# Patient Record
Sex: Female | Born: 1990 | Hispanic: Yes | Marital: Married | Smoking: Never smoker
Health system: Southern US, Community
[De-identification: ages and names within clinical notes are randomized; demographics above are authoritative.]

## PROBLEM LIST (undated history)

## (undated) ENCOUNTER — Emergency Department (HOSPITAL_COMMUNITY): Admission: EM | Payer: Medicaid Other

## (undated) DIAGNOSIS — O24419 Gestational diabetes mellitus in pregnancy, unspecified control: Secondary | ICD-10-CM

## (undated) DIAGNOSIS — Z789 Other specified health status: Secondary | ICD-10-CM

## (undated) HISTORY — PX: NO PAST SURGERIES: SHX2092

## (undated) HISTORY — DX: Gestational diabetes mellitus in pregnancy, unspecified control: O24.419

---

## 2016-10-29 ENCOUNTER — Ambulatory Visit (INDEPENDENT_AMBULATORY_CARE_PROVIDER_SITE_OTHER): Payer: Self-pay | Admitting: *Deleted

## 2016-10-29 DIAGNOSIS — Z3200 Encounter for pregnancy test, result unknown: Secondary | ICD-10-CM

## 2016-10-29 DIAGNOSIS — Z3201 Encounter for pregnancy test, result positive: Secondary | ICD-10-CM

## 2016-10-29 DIAGNOSIS — Z3400 Encounter for supervision of normal first pregnancy, unspecified trimester: Secondary | ICD-10-CM

## 2016-10-29 LAB — POCT PREGNANCY, URINE: Preg Test, Ur: POSITIVE — AB

## 2016-10-29 NOTE — Progress Notes (Signed)
Here for pregnancy test which was positive. LMP 07/04/16 and states has regular periods and is sure of that date. Is applying for medicaid. Would like to get prenatal care her. Will get blood work today and schedule anatomy us.

## 2016-10-31 LAB — HEMOGLOBINOPATHY EVALUATION
Ferritin: 47 ng/mL (ref 15–150)
HGB A2 QUANT: 2.3 % (ref 1.8–3.2)
HGB VARIANT: 0 %
Hgb A: 97.7 % (ref 96.4–98.8)
Hgb C: 0 %
Hgb F Quant: 0 % (ref 0.0–2.0)
Hgb S: 0 %
Hgb Solubility: NEGATIVE

## 2016-10-31 LAB — OBSTETRIC PANEL, INCLUDING HIV
Antibody Screen: NEGATIVE
BASOS ABS: 0 10*3/uL (ref 0.0–0.2)
Basos: 0 %
EOS (ABSOLUTE): 0.1 10*3/uL (ref 0.0–0.4)
Eos: 1 %
HEP B S AG: NEGATIVE
HIV SCREEN 4TH GENERATION: NONREACTIVE
Hematocrit: 39.4 % (ref 34.0–46.6)
Hemoglobin: 12.8 g/dL (ref 11.1–15.9)
IMMATURE GRANULOCYTES: 0 %
Immature Grans (Abs): 0 10*3/uL (ref 0.0–0.1)
LYMPHS ABS: 1.1 10*3/uL (ref 0.7–3.1)
Lymphs: 12 %
MCH: 29.2 pg (ref 26.6–33.0)
MCHC: 32.5 g/dL (ref 31.5–35.7)
MCV: 90 fL (ref 79–97)
MONOS ABS: 0.4 10*3/uL (ref 0.1–0.9)
Monocytes: 4 %
NEUTROS ABS: 7.7 10*3/uL — AB (ref 1.4–7.0)
Neutrophils: 83 %
PLATELETS: 228 10*3/uL (ref 150–379)
RBC: 4.38 x10E6/uL (ref 3.77–5.28)
RDW: 15.2 % (ref 12.3–15.4)
RPR: NONREACTIVE
Rh Factor: NEGATIVE
Rubella Antibodies, IGG: <0.9 index — ABNORMAL LOW (ref >0.99)
WBC: 9.3 10*3/uL (ref 3.4–10.8)

## 2016-10-31 NOTE — Progress Notes (Signed)
Agree with nursing staff's documentation of this patient's clinic encounter.  Ugonna Anyanwu, MD    

## 2016-11-01 ENCOUNTER — Encounter: Payer: Self-pay | Admitting: Obstetrics & Gynecology

## 2016-11-01 DIAGNOSIS — O9989 Other specified diseases and conditions complicating pregnancy, childbirth and the puerperium: Secondary | ICD-10-CM

## 2016-11-01 DIAGNOSIS — Z283 Underimmunization status: Secondary | ICD-10-CM | POA: Insufficient documentation

## 2016-11-01 DIAGNOSIS — O09899 Supervision of other high risk pregnancies, unspecified trimester: Secondary | ICD-10-CM | POA: Insufficient documentation

## 2016-11-06 ENCOUNTER — Encounter (HOSPITAL_COMMUNITY): Payer: Self-pay | Admitting: Obstetrics & Gynecology

## 2016-11-14 ENCOUNTER — Other Ambulatory Visit: Payer: Self-pay | Admitting: Obstetrics & Gynecology

## 2016-11-14 ENCOUNTER — Encounter (HOSPITAL_COMMUNITY): Payer: Self-pay | Admitting: *Deleted

## 2016-11-14 ENCOUNTER — Ambulatory Visit (HOSPITAL_COMMUNITY)
Admission: RE | Admit: 2016-11-14 | Discharge: 2016-11-14 | Disposition: A | Discharge status: Home / Self Care | Payer: Self-pay | Source: Ambulatory Visit | Attending: Obstetrics & Gynecology | Admitting: Obstetrics & Gynecology

## 2016-11-14 DIAGNOSIS — O360190 Maternal care for anti-D [Rh] antibodies, unspecified trimester, not applicable or unspecified: Secondary | ICD-10-CM | POA: Insufficient documentation

## 2016-11-14 DIAGNOSIS — Z3A23 23 weeks gestation of pregnancy: Secondary | ICD-10-CM | POA: Insufficient documentation

## 2016-11-14 DIAGNOSIS — Z3689 Encounter for other specified antenatal screening: Secondary | ICD-10-CM | POA: Insufficient documentation

## 2016-11-14 DIAGNOSIS — Z3400 Encounter for supervision of normal first pregnancy, unspecified trimester: Secondary | ICD-10-CM

## 2016-11-14 DIAGNOSIS — Z3687 Encounter for antenatal screening for uncertain dates: Secondary | ICD-10-CM | POA: Insufficient documentation

## 2016-11-14 DIAGNOSIS — Z3402 Encounter for supervision of normal first pregnancy, second trimester: Secondary | ICD-10-CM | POA: Insufficient documentation

## 2016-11-14 HISTORY — DX: Other specified health status: Z78.9

## 2016-11-22 ENCOUNTER — Ambulatory Visit (INDEPENDENT_AMBULATORY_CARE_PROVIDER_SITE_OTHER): Payer: Self-pay | Admitting: Advanced Practice Midwife

## 2016-11-22 ENCOUNTER — Encounter: Payer: Self-pay | Admitting: Advanced Practice Midwife

## 2016-11-22 VITALS — BP 113/74 | HR 88 | Ht 63.0 in | Wt 199.1 lb

## 2016-11-22 DIAGNOSIS — Z3400 Encounter for supervision of normal first pregnancy, unspecified trimester: Secondary | ICD-10-CM

## 2016-11-22 DIAGNOSIS — O0990 Supervision of high risk pregnancy, unspecified, unspecified trimester: Secondary | ICD-10-CM | POA: Insufficient documentation

## 2016-11-22 DIAGNOSIS — O9989 Other specified diseases and conditions complicating pregnancy, childbirth and the puerperium: Secondary | ICD-10-CM

## 2016-11-22 DIAGNOSIS — O0932 Supervision of pregnancy with insufficient antenatal care, second trimester: Secondary | ICD-10-CM

## 2016-11-22 DIAGNOSIS — O26892 Other specified pregnancy related conditions, second trimester: Secondary | ICD-10-CM

## 2016-11-22 DIAGNOSIS — Z124 Encounter for screening for malignant neoplasm of cervix: Secondary | ICD-10-CM

## 2016-11-22 DIAGNOSIS — Z113 Encounter for screening for infections with a predominantly sexual mode of transmission: Secondary | ICD-10-CM

## 2016-11-22 DIAGNOSIS — B3731 Acute candidiasis of vulva and vagina: Secondary | ICD-10-CM

## 2016-11-22 DIAGNOSIS — B373 Candidiasis of vulva and vagina: Secondary | ICD-10-CM

## 2016-11-22 DIAGNOSIS — Z283 Underimmunization status: Secondary | ICD-10-CM

## 2016-11-22 DIAGNOSIS — Z6791 Unspecified blood type, Rh negative: Secondary | ICD-10-CM

## 2016-11-22 DIAGNOSIS — Z23 Encounter for immunization: Secondary | ICD-10-CM

## 2016-11-22 DIAGNOSIS — Z2839 Other underimmunization status: Secondary | ICD-10-CM

## 2016-11-22 DIAGNOSIS — O09892 Supervision of other high risk pregnancies, second trimester: Secondary | ICD-10-CM

## 2016-11-22 LAB — POCT URINALYSIS DIP (DEVICE)
BILIRUBIN URINE: NEGATIVE
Glucose, UA: 100 mg/dL — AB
HGB URINE DIPSTICK: NEGATIVE
Ketones, ur: NEGATIVE mg/dL
Nitrite: NEGATIVE
PH: 5.5 (ref 5.0–8.0)
Protein, ur: NEGATIVE mg/dL
SPECIFIC GRAVITY, URINE: 1.02 (ref 1.005–1.030)
UROBILINOGEN UA: 0.2 mg/dL (ref 0.0–1.0)

## 2016-11-22 MED ORDER — PRENATAL PLUS 27-1 MG PO TABS: 1.0000 | ORAL_TABLET | Freq: Every day | ORAL | 12 refills | Status: DC

## 2016-11-22 MED ORDER — PRENATAL PLUS 27-1 MG PO TABS: 1.0000 | ORAL_TABLET | Freq: Every day | ORAL | 0 refills | Status: DC

## 2016-11-22 NOTE — Progress Notes (Signed)
Here for first prenatal care. We discussed that on the date she had her pregnancy test she said sure of LMP 07/04/16, on date of US said may have been 06/03/16. Now states 06/03/16 is correct so EDD changes. Given new patient education material. Signed up for babyscripts app only.

## 2016-11-22 NOTE — Patient Instructions (Signed)
AREA PEDIATRIC/FAMILY PRACTICE PHYSICIANS  [facility] [address], [address] [place], [place]  [address] Phone - [phone]   Fax - [phone]  [facility] [address] [place], [place] [address] Phone - [phone]   Fax - [phone]  [facility] [address] [place], [place]  [address] Phone - [phone]   Fax - [phone]  [facility] [address] [place], [place]  [address] Phone - [phone]   Fax - [phone]  [facility] [address] [place], [place]  [address] Phone - [phone]   Fax - [phone]  [facility] [address] [place], [place]  [address] Phone - [phone]   Fax - [phone]  [facility] [address] [place], [place]  [address] Phone - [phone]   Fax - [phone]  [facility] [address], [address] [place], [place]  [address] Phone - [phone]   Fax - [phone]  [facility] [address] [place], [place]  [address] Phone - [phone]   Fax - [phone] [facility] [address] [place], [place]  [address] Phone - [phone]   Fax - [phone]  [facility] [address] [place], [place]  [address] Phone - [phone]   Fax - [phone]  [facility] [address] [place], [place]  [address] Phone - [phone]   Fax - [phone]  [facility] [address], [address] [place], [place]  [address] Phone - [phone]   Fax - [phone]  [facility] GOSR[facility] [address] [place], [place]  [address] Phone - [phone]  [facility] [address] [place], [place]  [address] Phone - [phone]   Fax - [phone]  [facility] [address] [place], [place]  [address] Phone - [phone]   Fax - [phone]  [facility] [address] [place], [place]  [address] Phone - [phone]   Fax - [phone]  [facility] [address] [place], [place]  [address] Phone - [phone]   Fax - [phone]   [facility] [address] [place], [place]  [address] Phone - [phone]   Fax - [phone]  [facility] [address] [place], [place]  [address] Phone - [phone]   Fax - [phone]  [facility] [address] [place], [place]  [address] Phone - [phone]   Fax - [phone]  [facility] [address] [place], [place]  [address] Phone - [phone]   Fax - [phone]  [facility] [address] , [place]  [address] Phone - [phone]   Fax - [phone] [facility] [address] [place], [place]  [address] Phone - [phone]   Fax - [phone]  [facility] [address] [place], [place]  [address] Phone - [phone]   Fax - [phone]  [facility] [address] [place], [place]  [address] Phone - [phone]   Fax - [phone]  [facility]  Charlene Flemming MD [address]   [address] Phone [phone]  Fax [phone]   

## 2016-11-24 LAB — URINE CULTURE, OB REFLEX

## 2016-11-24 LAB — CULTURE, OB URINE

## 2016-11-26 DIAGNOSIS — Z6791 Unspecified blood type, Rh negative: Secondary | ICD-10-CM | POA: Insufficient documentation

## 2016-11-26 DIAGNOSIS — O26892 Other specified pregnancy related conditions, second trimester: Secondary | ICD-10-CM

## 2016-11-26 DIAGNOSIS — O0932 Supervision of pregnancy with insufficient antenatal care, second trimester: Secondary | ICD-10-CM | POA: Insufficient documentation

## 2016-11-26 LAB — CYTOLOGY - PAP
Chlamydia: NEGATIVE
Diagnosis: NEGATIVE
NEISSERIA GONORRHEA: NEGATIVE

## 2016-11-26 NOTE — Progress Notes (Signed)
  Subjective:    Loretha BrasilKaren Bermudez Sanchez is being seen today for her first obstetrical visit. No other care this pregnancy. Was seen in clinic for pregnancy verification 10/29/16 and Prenatal Panel and Anatomy US were ordered. Applying for pregnancy Medicaid. She is at 5458w5d gestation by LMP and 23 weeks US. Her obstetrical history is significant for nulliparity and late to care.  Patient does intend to breast feed. Pregnancy history fully reviewed.  Patient reports no complaints.  Review of Systems:   Review of Systems  Constitutional: Negative for chills and fever.  Gastrointestinal: Negative for abdominal pain.  Genitourinary: Negative for dysuria, vaginal bleeding and vaginal discharge.    Objective:     BP 113/74   Pulse 88   Ht 5\' 3"  (1.6 m)   Wt 199 lb 1.6 oz (90.3 kg)   LMP 06/03/2016   BMI 35.27 kg/m  Physical Exam  Nursing note and vitals reviewed. Constitutional: She is oriented to person, place, and time. She appears well-developed and well-nourished. No distress.  Eyes: No scleral icterus.  Neck: No thyromegaly present.  Cardiovascular: Normal rate and regular rhythm.  Respiratory: Effort normal and breath sounds normal. No respiratory distress.  GI: Soft. She exhibits no distension. There is no tenderness.  Genitourinary: No breast swelling, tenderness, discharge or bleeding. There is no lesion on the right labia. There is no lesion on the left labia. Uterus is enlarged. Uterus is not tender. Cervix exhibits discharge (large amount of curdlike discharge.) and friability (very friable w/ Pap). Cervix exhibits no motion tenderness. Right adnexum displays no mass, no tenderness and no fullness. Left adnexum displays no mass, no tenderness and no fullness. No tenderness or bleeding in the vagina. Vaginal discharge found.  Musculoskeletal: Normal range of motion. She exhibits no edema or deformity.  Neurological: She is alert and oriented to person, place, and time. She has  normal reflexes.  Skin: Skin is warm and dry.  Psychiatric: She has a normal mood and affect.    Maternal Exam:  Introitus: Vagina is positive for vaginal discharge.       Assessment:    Pregnancy: G1P0 Patient Active Problem List   Diagnosis Date Noted  . Supervision of normal first pregnancy, antepartum 11/22/2016  . Rubella non-immune status, antepartum 11/01/2016       Plan:  1. Supervision of normal first pregnancy, antepartum  - Culture, OB Urine - Cytology - PAP - Enroll Patient in Babyscripts - Flu Vaccine QUAD 36+ mos IM - prenatal vitamin w/FE, FA (PRENATAL 1 + 1) 27-1 MG TABS tablet; Take 1 tablet daily at 12 noon by mouth.  Dispense: 30 each; Refill: 12  2. Late to care  3. Rh neg  - Rhophylac at NV    4. Rubella NI   - Offer vaccination PP  5. VVC  - OTC Monistat 7   Initial labs reviewed. Prenatal vitamins. Problem list reviewed and updated. AFP3 discussed: too late for Quad. Discussed Panorama. Pt is self pay. Declines today.. Role of ultrasound in pregnancy discussed; fetal survey: results reviewed. Amniocentesis discussed: not indicated. Follow up in 4 weeks.    Dorathy KinsmanVirginia Kaislyn Gulas 11/26/2016

## 2016-12-19 ENCOUNTER — Encounter: Payer: Self-pay | Admitting: Medical

## 2016-12-20 ENCOUNTER — Ambulatory Visit (INDEPENDENT_AMBULATORY_CARE_PROVIDER_SITE_OTHER): Payer: Self-pay | Admitting: Student

## 2016-12-20 VITALS — BP 117/65 | HR 92 | Wt 203.1 lb

## 2016-12-20 DIAGNOSIS — Z23 Encounter for immunization: Secondary | ICD-10-CM

## 2016-12-20 DIAGNOSIS — Z3400 Encounter for supervision of normal first pregnancy, unspecified trimester: Secondary | ICD-10-CM

## 2016-12-20 MED ORDER — RHO D IMMUNE GLOBULIN 1500 UNIT/2ML IJ SOSY
300.0000 ug | PREFILLED_SYRINGE | Freq: Once | INTRAMUSCULAR | Status: AC
  Administered 2016-12-20: 300 ug via INTRAMUSCULAR

## 2016-12-20 NOTE — Progress Notes (Signed)
   PRENATAL VISIT NOTE  Subjective:  Kristen Fowler is a 26 y.o. G1P0 at 4981w4d being seen today for ongoing prenatal care.  She is currently monitored for the following issues for this low-risk pregnancy and has Rubella non-immune status, antepartum; Supervision of normal first pregnancy, antepartum; Rh negative, antepartum, second trimester; and Late prenatal care affecting pregnancy in second trimester on their problem list.  Patient reports no complaints.  Contractions: Not present. Vag. Bleeding: None.  Movement: Present. Denies leaking of fluid.   The following portions of the patient's history were reviewed and updated as appropriate: allergies, current medications, past family history, past medical history, past social history, past surgical history and problem list. Problem list updated.  Objective:   Vitals:   12/20/16 0755  BP: 117/65  Pulse: 92  Weight: 203 lb 1.6 oz (92.1 kg)    Fetal Status: Fetal Heart Rate (bpm): 151 Fundal Height: 28 cm Movement: Present     General:  Alert, oriented and cooperative. Patient is in no acute distress.  Skin: Skin is warm and dry. No rash noted.   Cardiovascular: Normal heart rate noted  Respiratory: Normal respiratory effort, no problems with respiration noted  Abdomen: Soft, gravid, appropriate for gestational age.  Pain/Pressure: Absent     Pelvic: Cervical exam deferred        Extremities: Normal range of motion.  Edema: None  Mental Status:  Normal mood and affect. Normal behavior. Normal judgment and thought content.   Assessment and Plan:  Pregnancy: G1P0 at 2581w4d  1. Supervision of normal first pregnancy, antepartum Patient doing well; no complaints. Extensive discussion today about LARC methods and circumcision decision.  - Glucose Tolerance, 2 Hours w/1 Hour - CBC - RPR - HIV antibody - Tdap vaccine greater than or equal to 7yo IM  Preterm labor symptoms and general obstetric precautions including but not  limited to vaginal bleeding, contractions, leaking of fluid and fetal movement were reviewed in detail with the patient. Please refer to After Visit Summary for other counseling recommendations.  Return in about 2 weeks (around 01/03/2017), or ROB.   Kristen Fowler, CNM

## 2016-12-20 NOTE — Progress Notes (Signed)
[employer] interpreter Claria DiceSan Juanita [phone]

## 2016-12-20 NOTE — Patient Instructions (Signed)
Etonogestrel implant Qu es este medicamento? El ETONOGESTREL es un dispositivo anticonceptivo (control de la natalidad). Se usa para evitar los embarazos. Se puede usar hasta por 3 aos. Este medicamento puede ser utilizado para otros usos; si tiene alguna pregunta consulte con su proveedor de atencin mdica o con su farmacutico. MARCAS COMUNES: Implanon, Nexplanon Qu le debo informar a mi profesional de la salud antes de tomar este medicamento? Necesita saber si usted presenta alguno de los siguientes problemas o situaciones: sangrado vaginal anormal enfermedad vascular o cogulos sanguneos cncer de mama, cervical, heptico depresin diabetes enfermedad de la vescula biliar dolores de cabeza enfermedad cardiaca o ataque cardiaco reciente alta presin sangunea alto nivel de colesterol enfermedad renal enfermedad heptica convulsiones fuma tabaco una reaccin alrgica o inusual al etonogestrel, otras hormonas, anestsicos o antispticos, medicamentos, alimentos, colorantes o conservantes si est embarazada o buscando quedar embarazada si est amamantando a un beb Cmo debo utilizar este medicamento? Un profesional de la salud inserta este dispositivo justo debajo de la piel en la parte interior del brazo. Hable con su pediatra para informarse acerca del uso de este medicamento en nios. Puede requerir atencin especial. Sobredosis: Pngase en contacto inmediatamente con un centro toxicolgico o una sala de urgencia si usted cree que haya tomado demasiado medicamento. ATENCIN: Este medicamento es solo para usted. No comparta este medicamento con nadie. Qu sucede si me olvido de una dosis? No se aplica en este caso. Qu puede interactuar con este medicamento? No tome este medicamento con ninguno de los siguientes frmacos: amprenavir bosentano fosamprenavir Este medicamento tambin podra interactuar con los siguientes medicamentos: barbitricos para inducir el sueo o para el  tratamiento de convulsiones ciertos medicamentos para infecciones micticas, tales como itraconazol y ketoconazol jugo de toronja griseofulvina medicamentos para tratar convulsiones, tales como carbamazepina, felbamato, oxcarbazepina, fenitona, topiramato modafinilo fenilbutazona rifampicina rufinamida algunos medicamentos para tratar la infeccin por el VIH, tales como atazanavir, indinavir, lopinavir, nelfinavir, tipranavir, ritonavir hierba de [place] Puede ser que esta lista no menciona todas las posibles interacciones. Informe a su profesional de la salud de todos los productos a base de hierbas, medicamentos de venta libre o suplementos nutritivos que est tomando. Si usted fuma, consume bebidas alcohlicas o si utiliza drogas ilegales, indqueselo tambin a su profesional de la salud. Algunas sustancias pueden interactuar con su medicamento. A qu debo estar atento al usar este medicamento? Este producto no protege contra la infeccin por el VIH (SIDA) u otras enfermedades de transmisin sexual. Usted debe sentir el implante al presionar con las yemas de los dedos sobre la piel donde se insert. Contacte a su mdico si no se siente el implante y usa un mtodo anticonceptivo no hormonal (como el condn) hasta que el mdico confirma que el implante est en su lugar. Si siente que el implante puede haber roto o doblado en su brazo, pngase en contacto con su proveedor de atencin mdica. Qu efectos secundarios puedo tener al utilizar este medicamento? Efectos secundarios que debe informar a su mdico o a su profesional de la salud tan pronto como sea posible: reacciones alrgicas, como erupcin cutnea, picazn o urticarias, e hinchazn de la cara, los labios o la lengua bultos en las mamas cambios en las emociones o el estado de nimo estado de nimo deprimido sangrado menstrual intenso o prolongado dolor, irritacin, hinchazn o moretones en el lugar de la insercin cicatriz en el lugar de la  insercin signos de infeccin en el sitio de insercin tales como fiebre, y enrojecimiento de   la piel, dolor o secrecin signos de Psychiatristembarazo signos y sntomas de un cogulo sanguneo, tales como problemas respiratorios; cambios en la visin; dolor en el pecho; dolor de cabeza severo, repentino; dolor, hinchazn, calor en la pierna; dificultad para hablar; entumecimiento o debilidad repentina de la cara, el brazo o la pierna signos y sntomas de lesin al hgado, como orina amarilla oscura o Elroymarrn; sensacin general de estar enfermo o sntomas gripales; heces claras; prdida de apetito; nuseas; dolor en la regin abdominal superior derecha; cansancio o debilidad inusual; color amarillento de los ojos o la piel sangrado vaginal inusual, secrecin signos y sntomas de un derrame cerebral, tales como cambios en la visin; confusin; dificultad para hablar o entender; dolores de cabeza severos; entumecimiento o debilidad repentina de la cara, el brazo o la pierna; problemas al Advertising account plannercaminar; Psychiatristmareo; prdida del equilibrio o la coordinacin Efectos secundarios que generalmente no requieren Psychologist, prison and probation servicesatencin mdica (infrmelos a su mdico o a Producer, television/film/videosu profesional de la salud si persisten o si son molestos): acn dolor de Retail buyerespalda dolor en las mamas cambios de peso mareos sensacin general de estar enfermo o sntomas gripales dolor de cabeza sangrado menstrual irregular nuseas dolor de garganta irritacin o inflamacin vaginal Puede ser que esta lista no menciona todos los posibles efectos secundarios. Comunquese a su mdico por asesoramiento mdico Hewlett-Packardsobre los efectos secundarios. Usted puede informar los efectos secundarios a la FDA por telfono al 1-800-FDA-1088. Dnde debo guardar mi medicina? Este medicamento se administra en hospitales o clnicas y no necesitar guardarlo en su domicilio. ATENCIN: Este folleto es un resumen. Puede ser que no cubra toda la posible informacin. Si usted tiene preguntas acerca de esta medicina,  consulte con su mdico, su farmacutico o su profesional de Radiographer, therapeuticla salud.  2018 Elsevier/Gold Standard (2016-01-26 00:00:00) Eleccin del mtodo anticonceptivo (Contraception Choices) La anticoncepcin (control de la natalidad) es el uso de cualquier mtodo o dispositivo para Location managerevitar el embarazo. A continuacin se indican algunos de esos mtodos. ANTICONCEPTIVOS HORMONALES  Un pequeo tubo colocado bajo la piel de la parte superior del brazo (implante). El tubo puede Geneticist, molecularpermanecer en el lugar durante 3 aos. El implante debe quitarse despus de 3 aos.  Inyecciones que se aplican cada 3 meses.  Pldoras que deben MetLifetomarse todos los das.  Parches que se cambian una vez por semana.  Un anillo que se coloca en la vagina (anillo vaginal). El anillo se deja en su lugar durante 3 semanas y se retira durante 1 semana. Luego se coloca un nuevo anillo en la vagina.  Pldoras para el control de la natalidad despus de Warehouse managertener sexo (relaciones sexuales) sin proteccin.  ANTICONCEPTIVOS DE Emeline DarlingBARRERA  Una cubierta delgada que se Botswanausa sobe el pene (condn masculino) que se coloca durante las relaciones sexuales.  Una cubierta blanda y suelta que se coloca en la vagina (condn femenino) antes de las relaciones sexuales.  Un dispositivo de goma que se aplica sobre el cuello del tero (diafragma). Este dispositivo debe ser hecho para usted. Se coloca en la vagina antes de Management consultanttener relaciones sexuales. Debe dejarlo colocado en la vagina durante 6 a 8 horas despus de las The St. Paul Travelersrelaciones sexuales.  Un capuchn pequeo y Du Pontsuave que se fija sobre el cuello del tero (capuchn cervical). Este capuchn debe ser hecho para usted. Debe dejarlo colocado en la vagina durante 48 horas despus de las The St. Paul Travelersrelaciones sexuales.  Una esponja que se coloca en la vagina antes de Management consultanttener relaciones sexuales.  Una sustancia qumica que destruye o impide que los espermatozoides  ingresen al cuello y al tero (espermicida). La sustancia qumica puede  ser en crema, gel, espuma o pldoras.  DISPOSITIVO DE CONTROL INTRAUTERINO (DIU)  El DIU es un pequeo dispositivo plstico en forma de T. Se coloca dentro del tero. Hay dos tipos de DIU: ? DIU de cobre. El dispositivo est recubierto en alambre de cobre. El cobre produce un lquido que Federated Department Storesdestruye los espermatozoides. Puede permanecer colocado durante 10 aos. ? DIU hormonal. La hormona impide que ocurra el embarazo. Puede permanecer colocado durante 5 aos.  MTODOS PERMANENTES  La mujer puede hacerse sellar, ligar u obstruir las trompas de [place] durante [place] and Herzegovinauna ciruga. Esto impide que el vulo llegue hasta el tero.  El mdico coloca un alambre diminuto o lo inserta en cada una de las trompas de AveraFalopio. Esto produce un tejido cicatrizal que obstruye las trompas de MillbrookFalopio.  En el hombre pueden ligarse los conductos por los que pasan los espermatozoides (vasectoma).  CONTROL DE LA NATALIDAD POR PLANIFICACIN FAMILIAR NATURAL  La planificacin familiar natural significa no tener Clinical research associaterelaciones sexuales o usar un mtodo anticonceptivo de Warehouse managerbarrera en los perodos frtiles de la [place].  Use a un almanaque para llevar un registro de la extensin de cada perodo y para National Cityconocer los das en los que puede quedar Nachusaembarazada.  Evite tener relaciones sexuales durante la ovulacin.  Use un termmetro para medir la Arts development officertemperatura corporal. Tambin reconozca los sntomas de la ovulacin.  El momento de EchoStartener relaciones sexuales debe ser despus de que la mujer Fairviewhaya ovulado. Use condones para protegerse de las enfermedades de transmisin sexual (ETS). Hgalo independientemente del tipo de ToysRusanticonceptivo que use. Hable con su mdico acerca de cul es el mejor mtodo anticonceptivo para usted. Esta informacin no tiene Theme park managercomo fin reemplazar el consejo del mdico. Asegrese de hacerle al mdico cualquier pregunta que tenga. Document Released: 04/11/2010 Document Revised: 12/30/2012 Document Reviewed:  07/16/2012 Elsevier Interactive Patient Education  2017 ArvinMeritorElsevier Inc.

## 2016-12-21 LAB — CBC
HEMOGLOBIN: 12.7 g/dL (ref 11.1–15.9)
Hematocrit: 38.9 % (ref 34.0–46.6)
MCH: 30 pg (ref 26.6–33.0)
MCHC: 32.6 g/dL (ref 31.5–35.7)
MCV: 92 fL (ref 79–97)
Platelets: 196 10*3/uL (ref 150–379)
RBC: 4.23 x10E6/uL (ref 3.77–5.28)
RDW: 14.9 % (ref 12.3–15.4)
WBC: 7.7 10*3/uL (ref 3.4–10.8)

## 2016-12-21 LAB — HIV ANTIBODY (ROUTINE TESTING W REFLEX): HIV Screen 4th Generation wRfx: NONREACTIVE

## 2016-12-21 LAB — RPR: RPR: NONREACTIVE

## 2016-12-21 LAB — GLUCOSE TOLERANCE, 2 HOURS W/ 1HR
GLUCOSE, 1 HOUR: 229 mg/dL — AB (ref 65–179)
GLUCOSE, FASTING: 110 mg/dL — AB (ref 65–91)
Glucose, 2 hour: 129 mg/dL (ref 65–152)

## 2016-12-22 DIAGNOSIS — O24419 Gestational diabetes mellitus in pregnancy, unspecified control: Secondary | ICD-10-CM | POA: Insufficient documentation

## 2017-01-02 ENCOUNTER — Telehealth: Payer: Self-pay | Admitting: General Practice

## 2017-01-02 NOTE — Telephone Encounter (Signed)
Called patient with pacific interpreter [phone], no answer- left message to call us back concerning results. Will send letter & make note in appt

## 2017-01-02 NOTE — Telephone Encounter (Signed)
-----   Message from Kristen Fowler, CNM sent at 12/22/2016  5:32 PM EST ----- Patient failed her 2 hour GTT; please set her up with diabetic educator appt, supplies. She will need an interpreter. THank you!

## 2017-01-09 ENCOUNTER — Ambulatory Visit (INDEPENDENT_AMBULATORY_CARE_PROVIDER_SITE_OTHER): Payer: Medicaid Other | Admitting: Obstetrics and Gynecology

## 2017-01-09 VITALS — BP 122/73 | HR 101 | Wt 203.1 lb

## 2017-01-09 DIAGNOSIS — O0932 Supervision of pregnancy with insufficient antenatal care, second trimester: Secondary | ICD-10-CM

## 2017-01-09 DIAGNOSIS — O0990 Supervision of high risk pregnancy, unspecified, unspecified trimester: Secondary | ICD-10-CM

## 2017-01-09 NOTE — Patient Instructions (Signed)
Diagnstico de diabetes mellitus gestacional  (Gestational Diabetes Mellitus, Diagnosis)  La diabetes gestacional (diabetes mellitus gestacional) es una forma de diabetes a corto plazo (temporal) que puede aparecer durante el embarazo. Este cuadro desaparece despus del parto. Puede deberse a uno de estos problemas o a ambos:   El cuerpo no produce la cantidad suficiente de una hormona llamada insulina.   El cuerpo no responde de forma normal a la insulina que produce.  La insulina permite que los ciertos azcares (glucosa) ingresen a las clulas del cuerpo. Esto le proporciona la energa. Cuando se tiene diabetes, la glucosa no puede ingresar a las clulas. Esto produce un aumento del nivel de glucosa en la sangre (hiperglucemia).  Si la diabetes se trata, es posible que ni usted ni el beb se vean afectados. El mdico fijar los objetivos del tratamiento para usted. Generalmente, los resultados de la glucemia deben ser los siguientes:   Despus de no comer durante mucho tiempo (ayunar): 95mg/dl (5,3mmol/l).   Despus de las comidas (posprandial):  ? Una hora despus de una comida: igual o menor que 140mg/dl (7,8mmol/l).  ? Dos horas despus de una comida: igual o menor que 120mg/dl (6,7mmol/l).   Nivel de A1c (hemoglobinaA1c): del 6% al 6,5%.  CUIDADOS EN EL HOGAR  Preguntas para hacerle al mdico  Puede hacer las siguientes preguntas:   Debo reunirme con un instructor para el cuidado de la diabetes?   Dnde puedo encontrar un grupo de apoyo para personas diabticas?   Qu equipos necesitar para cuidarme en casa?   Qu medicamentos para la diabetes necesito? Cundo debo tomarlos?   Con qu frecuencia debo controlarme el nivel de glucosa en la sangre?   A qu nmero puedo llamar si tengo preguntas?   Cundo es la prxima cita con el mdico?  Instrucciones generales   Tome los medicamentos de venta libre y los recetados solamente como se lo haya indicado el mdico.   Mantenga un peso  saludable durante el embarazo.   Concurra a todas las visitas de control como se lo haya indicado el mdico. Esto es importante.  SOLICITE AYUDA SI:   Su nivel de glucosa en la sangre es igual o superior a 240mg/dl (13,3mmol/dl).   Su nivel de glucosa en la sangre es igual o superior a 200mg/dl (11,1mmol/l), y tiene cetonas en la orina.   Ha estado enferma o ha tenido fiebre durante 2o ms das y no mejora.   Si tiene alguno de estos problemas durante ms de 6horas:  ? No puede comer ni beber.  ? Siente malestar estomacal (nuseas).  ? Vomita.  ? La materia fecal es lquida (diarrea).  SOLICITE AYUDA DE INMEDIATO SI:   El nivel de glucosa en la sangre est por debajo de 54mg/dl (3mmol/l).   Est confundida.   Tiene dificultad para hacer lo siguiente:  ? Pensar con claridad.  ? Respirar.   El beb se mueve menos de lo normal.   Tiene los siguientes sntomas:  ? Niveles moderados o altos de cetonas en la orina.  ? Hemorragia vaginal.  ? Secrecin de un lquido fuera de lo comn de la vagina.  ? Contracciones prematuras. Estas pueden causar una sensacin de opresin en el vientre.  Esta informacin no tiene como fin reemplazar el consejo del mdico. Asegrese de hacerle al mdico cualquier pregunta que tenga.  Document Released: 04/18/2015 Document Revised: 04/18/2015 Document Reviewed: 01/28/2015  Elsevier Interactive Patient Education  2018 Elsevier Inc.

## 2017-01-09 NOTE — Progress Notes (Signed)
   PRENATAL VISIT NOTE  Subjective:  Kristen Fowler is a 27 y.o. G1P0 at 2739w3d being seen today for ongoing prenatal care.  She is currently monitored for the following issues for this high-risk pregnancy and has Rubella non-immune status, antepartum; Supervision of normal first pregnancy, antepartum; Rh negative, antepartum, second trimester; Late prenatal care affecting pregnancy in second trimester; and Gestational diabetes on their problem list.  Patient reports no complaints.  Contractions: Not present. Vag. Bleeding: None.  Movement: Present. Denies leaking of fluid.   The following portions of the patient's history were reviewed and updated as appropriate: allergies, current medications, past family history, past medical history, past social history, past surgical history and problem list. Problem list updated.  Objective:   Vitals:   01/09/17 1000  BP: 122/73  Pulse: (!) 101  Weight: 203 lb 1.6 oz (92.1 kg)    Fetal Status: Fetal Heart Rate (bpm): 132 Fundal Height: 30 cm Movement: Present     General:  Alert, oriented and cooperative. Patient is in no acute distress.  Skin: Skin is warm and dry. No rash noted.   Cardiovascular: Normal heart rate noted  Respiratory: Normal respiratory effort, no problems with respiration noted  Abdomen: Soft, gravid, appropriate for gestational age.  Pain/Pressure: Absent     Pelvic: Cervical exam deferred        Extremities: Normal range of motion.  Edema: None  Mental Status:  Normal mood and affect. Normal behavior. Normal judgment and thought content.   Assessment and Plan:  Pregnancy: G1P0 at 7939w3d  1. Supervision of high-risk first pregnancy, antepartum - Discussed results of GTT and dx of GDM - Has diabetes counselor appt on 01/10/2017 - Advised that GDM makes her pregnancy high-risk and discussed growth sono needed at 38 wks and delivery by 40 wks, if no other complications arise  2. Late prenatal care affecting pregnancy  in second trimester   Preterm labor symptoms and general obstetric precautions including but not limited to vaginal bleeding, contractions, leaking of fluid and fetal movement were reviewed in detail with the patient. Please refer to After Visit Summary for other counseling recommendations.  Return tomorrow 01/10/2017 for diabetic counselor appt and in about 2 weeks (around 01/23/2017).  Visit interpreted with [employer] video interpreter Namon CirriBrenda Chesley Veasey, CNM

## 2017-01-09 NOTE — Progress Notes (Signed)
Informed patient she has GDM , states she did not get letter. Agreed to come tomorrow to see diabetic educator.

## 2017-01-10 ENCOUNTER — Other Ambulatory Visit: Payer: Self-pay

## 2017-01-28 ENCOUNTER — Ambulatory Visit (INDEPENDENT_AMBULATORY_CARE_PROVIDER_SITE_OTHER): Payer: Medicaid Other | Admitting: Advanced Practice Midwife

## 2017-01-28 VITALS — BP 127/80 | HR 101 | Wt 202.9 lb

## 2017-01-28 DIAGNOSIS — O0993 Supervision of high risk pregnancy, unspecified, third trimester: Secondary | ICD-10-CM | POA: Diagnosis not present

## 2017-01-28 DIAGNOSIS — O24419 Gestational diabetes mellitus in pregnancy, unspecified control: Secondary | ICD-10-CM | POA: Diagnosis not present

## 2017-01-28 DIAGNOSIS — O0990 Supervision of high risk pregnancy, unspecified, unspecified trimester: Secondary | ICD-10-CM

## 2017-01-28 DIAGNOSIS — O09899 Supervision of other high risk pregnancies, unspecified trimester: Secondary | ICD-10-CM

## 2017-01-28 DIAGNOSIS — O9989 Other specified diseases and conditions complicating pregnancy, childbirth and the puerperium: Secondary | ICD-10-CM

## 2017-01-28 DIAGNOSIS — Z6791 Unspecified blood type, Rh negative: Secondary | ICD-10-CM | POA: Diagnosis not present

## 2017-01-28 DIAGNOSIS — Z283 Underimmunization status: Secondary | ICD-10-CM | POA: Diagnosis not present

## 2017-01-28 DIAGNOSIS — O26892 Other specified pregnancy related conditions, second trimester: Secondary | ICD-10-CM

## 2017-01-28 NOTE — Progress Notes (Signed)
   PRENATAL VISIT NOTE [employer] Int: # [id number]  Subjective:  Kristen Fowler is a 27 y.o. G1P0 at 6764w1d being seen today for ongoing prenatal care.  She is currently monitored for the following issues for this high-risk pregnancy and has Rubella non-immune status, antepartum; Supervision of high risk pregnancy, antepartum; Rh negative, antepartum, second trimester; Late prenatal care affecting pregnancy in second trimester; and Gestational diabetes on their problem list.  Patient reports no complaints.  Contractions: Not present. Vag. Bleeding: None.  Movement: Present. Denies leaking of fluid.    She does not have her CBG log with her today. She does not remember what her numbers have been or about what they have been.   The following portions of the patient's history were reviewed and updated as appropriate: allergies, current medications, past family history, past medical history, past social history, past surgical history and problem list. Problem list updated.  Objective:   Vitals:   01/28/17 1205  BP: 127/80  Pulse: (!) 101  Weight: 202 lb 14.4 oz (92 kg)    Fetal Status: Fetal Heart Rate (bpm): 151   Movement: Present     General:  Alert, oriented and cooperative. Patient is in no acute distress.  Skin: Skin is warm and dry. No rash noted.   Cardiovascular: Normal heart rate noted  Respiratory: Normal respiratory effort, no problems with respiration noted  Abdomen: Soft, gravid, appropriate for gestational age.  Pain/Pressure: Absent     Pelvic: Cervical exam deferred        Extremities: Normal range of motion.  Edema: None  Mental Status:  Normal mood and affect. Normal behavior. Normal judgment and thought content.   Assessment and Plan:  Pregnancy: G1P0 at 5564w1d  1. Supervision of high risk pregnancy, antepartum - Routine care  2. Rubella non-immune status, antepartum - Plan for vax PP  3. Gestational diabetes mellitus (GDM) in third trimester, gestational  diabetes method of control unspecified - No CBG record today. Reviewed importance of bringing log with her to every visit.  - S>D today, will schedule US for growth.   4. Rh negative, antepartum, second trimester -Rhogam given at 28 weeks   Preterm labor symptoms and general obstetric precautions including but not limited to vaginal bleeding, contractions, leaking of fluid and fetal movement were reviewed in detail with the patient. Please refer to After Visit Summary for other counseling recommendations.  Return in about 1 week (around 02/04/2017).   Thressa ShellerHeather Hogan, CNM       Guidelines for Antenatal Testing and Sonography  (with updated ICD-10 codes) INDICATION U/S NST/AFI DELIVERY  Diabetes   A1 - good control - O24.410    A2 - good control - O24.419      A2  - poor control or poor compliance - O24.419, E11.65   (Macrosomia or polyhydramnios) **E11.65 is extra code for poor control**    A2/B - O24.919  and B-C O24.319  Poor control B-C or D-R-F-T - O24.319  or  Type I DM - O24.019  20-38  20-38  20-24-28-32-36   20-24-28-32-35-38//fetal echo  20-24-27-30-33-36-38//fetal echo  40  32//2 x wk or BPP wkly  32//2 x wk or BPP wkly   32//2 x wk or BPP wkly  28//BPP wkly then 32//2 x wk or BPP wkly  40  39  PRN   39  PRN

## 2017-01-30 ENCOUNTER — Ambulatory Visit (HOSPITAL_COMMUNITY)
Admission: RE | Admit: 2017-01-30 | Discharge: 2017-01-30 | Disposition: A | Discharge status: Home / Self Care | Payer: Medicaid Other | Source: Ambulatory Visit | Attending: Advanced Practice Midwife | Admitting: Advanced Practice Midwife

## 2017-01-30 ENCOUNTER — Other Ambulatory Visit: Payer: Self-pay | Admitting: Advanced Practice Midwife

## 2017-01-30 DIAGNOSIS — Z3A34 34 weeks gestation of pregnancy: Secondary | ICD-10-CM

## 2017-01-30 DIAGNOSIS — O0990 Supervision of high risk pregnancy, unspecified, unspecified trimester: Secondary | ICD-10-CM | POA: Diagnosis present

## 2017-01-30 DIAGNOSIS — Z362 Encounter for other antenatal screening follow-up: Secondary | ICD-10-CM

## 2017-01-30 DIAGNOSIS — O24419 Gestational diabetes mellitus in pregnancy, unspecified control: Secondary | ICD-10-CM

## 2017-01-30 DIAGNOSIS — O3663X0 Maternal care for excessive fetal growth, third trimester, not applicable or unspecified: Secondary | ICD-10-CM

## 2017-02-01 DIAGNOSIS — O3660X0 Maternal care for excessive fetal growth, unspecified trimester, not applicable or unspecified: Secondary | ICD-10-CM | POA: Insufficient documentation

## 2017-02-05 ENCOUNTER — Encounter: Payer: Self-pay | Admitting: Student

## 2017-02-06 ENCOUNTER — Encounter: Payer: Self-pay | Admitting: Obstetrics & Gynecology

## 2017-02-06 ENCOUNTER — Encounter: Payer: Self-pay | Admitting: Obstetrics and Gynecology

## 2017-02-06 ENCOUNTER — Ambulatory Visit (INDEPENDENT_AMBULATORY_CARE_PROVIDER_SITE_OTHER): Payer: Medicaid Other | Admitting: Obstetrics and Gynecology

## 2017-02-06 VITALS — BP 130/80 | HR 104 | Wt 206.5 lb

## 2017-02-06 DIAGNOSIS — O0990 Supervision of high risk pregnancy, unspecified, unspecified trimester: Secondary | ICD-10-CM

## 2017-02-06 DIAGNOSIS — Z2839 Other underimmunization status: Secondary | ICD-10-CM

## 2017-02-06 DIAGNOSIS — O24419 Gestational diabetes mellitus in pregnancy, unspecified control: Secondary | ICD-10-CM | POA: Diagnosis present

## 2017-02-06 DIAGNOSIS — O26892 Other specified pregnancy related conditions, second trimester: Secondary | ICD-10-CM

## 2017-02-06 DIAGNOSIS — Z283 Underimmunization status: Secondary | ICD-10-CM | POA: Diagnosis not present

## 2017-02-06 DIAGNOSIS — O09892 Supervision of other high risk pregnancies, second trimester: Secondary | ICD-10-CM | POA: Diagnosis not present

## 2017-02-06 DIAGNOSIS — O3663X0 Maternal care for excessive fetal growth, third trimester, not applicable or unspecified: Secondary | ICD-10-CM | POA: Diagnosis not present

## 2017-02-06 DIAGNOSIS — O9989 Other specified diseases and conditions complicating pregnancy, childbirth and the puerperium: Secondary | ICD-10-CM | POA: Diagnosis not present

## 2017-02-06 DIAGNOSIS — Z113 Encounter for screening for infections with a predominantly sexual mode of transmission: Secondary | ICD-10-CM

## 2017-02-06 DIAGNOSIS — Z6791 Unspecified blood type, Rh negative: Secondary | ICD-10-CM | POA: Diagnosis not present

## 2017-02-06 LAB — OB RESULTS CONSOLE GBS: GBS: NEGATIVE

## 2017-02-06 NOTE — Progress Notes (Signed)
Subjective:  Kristen Fowler is a 27 y.o. G1P0 at 5568w3d being seen today for ongoing prenatal care.  She is currently monitored for the following issues for this high-risk pregnancy and has Rubella non-immune status, antepartum; Supervision of high risk pregnancy, antepartum; Rh negative, antepartum, second trimester; Late prenatal care affecting pregnancy in second trimester; Gestational diabetes; and Large for gestational age fetus affecting management of mother on their problem list.  Patient reports no complaints.  Contractions: Not present. Vag. Bleeding: None.  Movement: Present. Denies leaking of fluid.   The following portions of the patient's history were reviewed and updated as appropriate: allergies, current medications, past family history, past medical history, past social history, past surgical history and problem list. Problem list updated.  Objective:   Vitals:   02/06/17 1451  BP: 130/80  Pulse: (!) 104  Weight: 206 lb 8 oz (93.7 kg)    Fetal Status: Fetal Heart Rate (bpm): 147   Movement: Present     General:  Alert, oriented and cooperative. Patient is in no acute distress.  Skin: Skin is warm and dry. No rash noted.   Cardiovascular: Normal heart rate noted  Respiratory: Normal respiratory effort, no problems with respiration noted  Abdomen: Soft, gravid, appropriate for gestational age. Pain/Pressure: Absent     Pelvic:  Cervical exam performed        Extremities: Normal range of motion.  Edema: None  Mental Status: Normal mood and affect. Normal behavior. Normal judgment and thought content.   Urinalysis:      Assessment and Plan:  Pregnancy: G1P0 at 4368w3d  1. Gestational diabetes mellitus (GDM) in third trimester, gestational diabetes method of control unspecified Pt reports she did not know that she had GDM. Pt informed that she does have GDM. Will schedule to see DM educator tomorrow Glucometer and supplies provided to pt today U/S 01/30/17 EFW 3101  gms > 90%, AC > 97%  2. Supervision of high risk pregnancy, antepartum Stable - Strep Gp B NAA - Cervicovaginal ancillary only  3. Rubella non-immune status, antepartum Vaccine PP  4. Rh negative, antepartum, second trimester S/P Rhogam  5. Excessive fetal growth affecting management of pregnancy in third trimester, single or unspecified fetus See above  Preterm labor symptoms and general obstetric precautions including but not limited to vaginal bleeding, contractions, leaking of fluid and fetal movement were reviewed in detail with the patient. Please refer to After Visit Summary for other counseling recommendations.  Return in about 1 week (around 02/13/2017) for OB visit .   Hermina StaggersErvin, Kenzi Bardwell L, MD

## 2017-02-06 NOTE — Progress Notes (Signed)
[employer] video Spanish interpreter [phone]#[id number] used

## 2017-02-06 NOTE — Patient Instructions (Signed)
Diagnstico de diabetes mellitus gestacional  (Gestational Diabetes Mellitus, Diagnosis)  La diabetes gestacional (diabetes mellitus gestacional) es una forma de diabetes a corto plazo (temporal) que puede aparecer durante el embarazo. Este cuadro desaparece despus del parto. Puede deberse a uno de estos problemas o a ambos:   El cuerpo no produce la cantidad suficiente de una hormona llamada insulina.   El cuerpo no responde de forma normal a la insulina que produce.  La insulina permite que los ciertos azcares (glucosa) ingresen a las clulas del cuerpo. Esto le proporciona la energa. Cuando se tiene diabetes, la glucosa no puede ingresar a las clulas. Esto produce un aumento del nivel de glucosa en la sangre (hiperglucemia).  Si la diabetes se trata, es posible que ni usted ni el beb se vean afectados. El mdico fijar los objetivos del tratamiento para usted. Generalmente, los resultados de la glucemia deben ser los siguientes:   Despus de no comer durante mucho tiempo (ayunar): 95mg/dl (5,3mmol/l).   Despus de las comidas (posprandial):  ? Una hora despus de una comida: igual o menor que 140mg/dl (7,8mmol/l).  ? Dos horas despus de una comida: igual o menor que 120mg/dl (6,7mmol/l).   Nivel de A1c (hemoglobinaA1c): del 6% al 6,5%.  CUIDADOS EN EL HOGAR  Preguntas para hacerle al mdico  Puede hacer las siguientes preguntas:   Debo reunirme con un instructor para el cuidado de la diabetes?   Dnde puedo encontrar un grupo de apoyo para personas diabticas?   Qu equipos necesitar para cuidarme en casa?   Qu medicamentos para la diabetes necesito? Cundo debo tomarlos?   Con qu frecuencia debo controlarme el nivel de glucosa en la sangre?   A qu nmero puedo llamar si tengo preguntas?   Cundo es la prxima cita con el mdico?  Instrucciones generales   Tome los medicamentos de venta libre y los recetados solamente como se lo haya indicado el mdico.   Mantenga un peso  saludable durante el embarazo.   Concurra a todas las visitas de control como se lo haya indicado el mdico. Esto es importante.  SOLICITE AYUDA SI:   Su nivel de glucosa en la sangre es igual o superior a 240mg/dl (13,3mmol/dl).   Su nivel de glucosa en la sangre es igual o superior a 200mg/dl (11,1mmol/l), y tiene cetonas en la orina.   Ha estado enferma o ha tenido fiebre durante 2o ms das y no mejora.   Si tiene alguno de estos problemas durante ms de 6horas:  ? No puede comer ni beber.  ? Siente malestar estomacal (nuseas).  ? Vomita.  ? La materia fecal es lquida (diarrea).  SOLICITE AYUDA DE INMEDIATO SI:   El nivel de glucosa en la sangre est por debajo de 54mg/dl (3mmol/l).   Est confundida.   Tiene dificultad para hacer lo siguiente:  ? Pensar con claridad.  ? Respirar.   El beb se mueve menos de lo normal.   Tiene los siguientes sntomas:  ? Niveles moderados o altos de cetonas en la orina.  ? Hemorragia vaginal.  ? Secrecin de un lquido fuera de lo comn de la vagina.  ? Contracciones prematuras. Estas pueden causar una sensacin de opresin en el vientre.  Esta informacin no tiene como fin reemplazar el consejo del mdico. Asegrese de hacerle al mdico cualquier pregunta que tenga.  Document Released: 04/18/2015 Document Revised: 04/18/2015 Document Reviewed: 01/28/2015  Elsevier Interactive Patient Education  2018 Elsevier Inc.

## 2017-02-07 ENCOUNTER — Encounter: Payer: Medicaid Other | Attending: Family Medicine | Admitting: *Deleted

## 2017-02-07 ENCOUNTER — Ambulatory Visit: Payer: Self-pay | Admitting: *Deleted

## 2017-02-07 ENCOUNTER — Telehealth: Payer: Self-pay | Admitting: General Practice

## 2017-02-07 DIAGNOSIS — R7309 Other abnormal glucose: Secondary | ICD-10-CM

## 2017-02-07 DIAGNOSIS — Z713 Dietary counseling and surveillance: Secondary | ICD-10-CM | POA: Diagnosis not present

## 2017-02-07 DIAGNOSIS — R7302 Impaired glucose tolerance (oral): Secondary | ICD-10-CM | POA: Insufficient documentation

## 2017-02-07 LAB — CERVICOVAGINAL ANCILLARY ONLY
Chlamydia: NEGATIVE
Neisseria Gonorrhea: NEGATIVE

## 2017-02-07 NOTE — Progress Notes (Signed)
  Patient was seen on 02/07/2017 for Gestational Diabetes self-management. She is here with her cousin and a Spanish interpretor. She states no history of GDM but states she has an aunt who has Type 2 Diabetes. EDD: 03/10/2017. Diet history obtained. Good variety of all food groups but with larger portion sizes and some meals with no protein source observed.  The following learning objectives were met by the patient :   States the definition of Gestational Diabetes  States why dietary management is important in controlling blood glucose  Describes the effects of carbohydrates on blood glucose levels  Demonstrates ability to create a balanced meal plan  Demonstrates carbohydrate counting   States when to check blood glucose levels  Demonstrates proper blood glucose monitoring techniques  States the effect of stress and exercise on blood glucose levels  States the importance of limiting caffeine and abstaining from alcohol and smoking  Plan:  Aim for 3 Carb Choices per meal (45 grams) +/- 1 either way  Aim for 1-2 Carbs per snack Begin reading food labels for Total Carbohydrate of foods Consider  increasing your activity level by walking or other activity daily as tolerated Begin checking BG before breakfast and 2 hours after first bite of breakfast, lunch and dinner as directed by MD  Bring Log Book to every medical appointment   Take medication if directed by MD  Blood glucose monitor given: [employer] Track Lot # [id number] Exp: 04/28/2018 Blood glucose reading: 208 mg/dl after breakfast meal  I explained that this was very high. I noted her MD appointment is not until 02/18/2017 so I plan to get that moved up if at all possible.   Patient instructed to monitor glucose levels: FBS: 60 - 95 mg/dl 2 hour: <120 mg/dl  Patient received the following handouts: in Spanish  Nutrition Diabetes and Pregnancy  Carbohydrate Counting List  Patient will be seen for follow-up in one month

## 2017-02-07 NOTE — Telephone Encounter (Signed)
Called and notified patient of change of appointment.  Per Meriam SpragueBeverly, Diabetes Educator, patient needed to be seen sooner due to elevated blood sugars.  Interpreter was used for this call.  Patient voiced understanding.

## 2017-02-08 LAB — STREP GP B NAA: Strep Gp B NAA: NEGATIVE

## 2017-02-11 ENCOUNTER — Ambulatory Visit: Payer: Self-pay

## 2017-02-11 ENCOUNTER — Ambulatory Visit (INDEPENDENT_AMBULATORY_CARE_PROVIDER_SITE_OTHER): Payer: Medicaid Other | Admitting: Family Medicine

## 2017-02-11 VITALS — BP 126/54 | HR 85 | Wt 207.6 lb

## 2017-02-11 DIAGNOSIS — O0993 Supervision of high risk pregnancy, unspecified, third trimester: Secondary | ICD-10-CM | POA: Diagnosis not present

## 2017-02-11 DIAGNOSIS — O24419 Gestational diabetes mellitus in pregnancy, unspecified control: Secondary | ICD-10-CM | POA: Diagnosis not present

## 2017-02-11 DIAGNOSIS — Z283 Underimmunization status: Secondary | ICD-10-CM

## 2017-02-11 DIAGNOSIS — O9989 Other specified diseases and conditions complicating pregnancy, childbirth and the puerperium: Secondary | ICD-10-CM | POA: Diagnosis not present

## 2017-02-11 DIAGNOSIS — O0990 Supervision of high risk pregnancy, unspecified, unspecified trimester: Secondary | ICD-10-CM | POA: Diagnosis not present

## 2017-02-11 DIAGNOSIS — O3663X0 Maternal care for excessive fetal growth, third trimester, not applicable or unspecified: Secondary | ICD-10-CM | POA: Diagnosis not present

## 2017-02-11 DIAGNOSIS — Z2839 Other underimmunization status: Secondary | ICD-10-CM

## 2017-02-11 MED ORDER — METFORMIN HCL 500 MG PO TABS: 500.0000 mg | ORAL_TABLET | Freq: Two times a day (BID) | ORAL | 1 refills | Status: DC

## 2017-02-11 NOTE — Progress Notes (Signed)
[employer] interpreter [employer]

## 2017-02-11 NOTE — Progress Notes (Deleted)
Subjective:  Kristen Fowler is a 27 y.o. G1P0 at 499w1d being seen today for ongoing prenatal care.  She is currently monitored for the following issues for this high-risk pregnancy and has Rubella non-immune status, antepartum; Supervision of high risk pregnancy, antepartum; Rh negative, antepartum, second trimester; Late prenatal care affecting pregnancy in second trimester; Gestational diabetes; and Large for gestational age fetus affecting management of mother on their problem list.  GDM: Patient not currently taking any medication.  Reports no hypoglycemic episodes.  Fasting: 95 2hr PP: 120-131, one recorded level of 208  Patient reports no complaints.  Contractions: Not present. Vag. Bleeding: None.  Movement: Present. Denies leaking of fluid.   The following portions of the patient's history were reviewed and updated as appropriate: allergies, current medications, past family history, past medical history, past social history, past surgical history and problem list. Problem list updated.  Objective:   Vitals:   02/11/17 1413  BP: (!) 126/54  Pulse: 85  Weight: 207 lb 9.6 oz (94.2 kg)    Fetal Status: Fetal Heart Rate (bpm): 144   Movement: Present     General:  Alert, oriented and cooperative. Patient is in no acute distress.  Skin: Skin is warm and dry. No rash noted.   Cardiovascular: Normal heart rate and rhythm noted.  No murmurs, rubs, or gallops appreciated.  Respiratory: Normal respiratory effort, no problems with respiration noted  Abdomen: Soft, gravid, appropriate for gestational age. Pain/Pressure: Absent     Pelvic: Vag. Bleeding: None     {Blank single:19197::"Cervical exam performed","Cervical exam deferred"}        Extremities: Normal range of motion.  Edema: None  Mental Status: Normal mood and affect. Normal behavior. Normal judgment and thought content.   Urinalysis:      Assessment and Plan:  Pregnancy: G1P0 at 379w1d  There are no diagnoses linked  to this encounter. Term labor symptoms and general obstetric precautions including but not limited to vaginal bleeding, contractions, leaking of fluid and fetal movement were reviewed in detail with the patient. Please refer to After Visit Summary for other counseling recommendations.  No Follow-up on file.   Evoleth Nordmeyer, Laurena SlimmerBenjamin A, Medical Student

## 2017-02-11 NOTE — Progress Notes (Signed)

## 2017-02-11 NOTE — Progress Notes (Signed)
Subjective:  Kristen Fowler is a 27 y.o. G1P0 at 6247w1d being seen today for ongoing prenatal care.  She is currently monitored for the following issues for this  High risk pregnancy and has Rubella non-immune status, antepartum; Supervision of high risk pregnancy, antepartum; Rh negative, antepartum, second trimester; Late prenatal care affecting pregnancy in second trimester; Gestational diabetes; and Large for gestational age fetus affecting management of mother on their problem list.  GDM: Patient diet controlled thus far. Just started checking blood sugars last week. Fasting: 95 2hr PP: 120-131 Most of the values are the same, but patient insists that these are the numbers that the machine reads to her.  Patient reports no complaints.  Contractions: Not present. Vag. Bleeding: None.  Movement: Present. Denies leaking of fluid.   The following portions of the patient's history were reviewed and updated as appropriate: allergies, current medications, past family history, past medical history, past social history, past surgical history and problem list. Problem list updated.  Objective:   Vitals:   02/11/17 1413  BP: (!) 126/54  Pulse: 85  Weight: 207 lb 9.6 oz (94.2 kg)    Fetal Status: Fetal Heart Rate (bpm): 144   Movement: Present     General:  Alert, oriented and cooperative. Patient is in no acute distress.  Skin: Skin is warm and dry. No rash noted.   Cardiovascular: Normal heart rate noted  Respiratory: Normal respiratory effort, no problems with respiration noted  Abdomen: Soft, gravid, appropriate for gestational age. Pain/Pressure: Absent     Pelvic: Vag. Bleeding: None     Cervical exam deferred        Extremities: Normal range of motion.  Edema: None  Mental Status: Normal mood and affect. Normal behavior. Normal judgment and thought content.   Urinalysis:      Assessment and Plan:  Pregnancy: G1P0 at 7547w1d  1. Supervision of high risk pregnancy,  antepartum FHT and FH normal  2. Gestational diabetes mellitus (GDM) in third trimester, gestational diabetes method of control unspecified Start metformin 500mg  BID. Discussed importance of GDM Start antenatal testing: NST reactive, BPP: 10/10  3. Rubella non-immune status, antepartum  4. Excessive fetal growth affecting management of pregnancy in third trimester, single or unspecified fetus  f/u US in 2 weeks  Preterm labor symptoms and general obstetric precautions including but not limited to vaginal bleeding, contractions, leaking of fluid and fetal movement were reviewed in detail with the patient. Please refer to After Visit Summary for other counseling recommendations.  No Follow-up on file.   Levie HeritageStinson, Jacob J, DO

## 2017-02-13 ENCOUNTER — Other Ambulatory Visit: Payer: Self-pay

## 2017-02-15 ENCOUNTER — Other Ambulatory Visit: Payer: Self-pay | Admitting: Advanced Practice Midwife

## 2017-02-15 DIAGNOSIS — Z3400 Encounter for supervision of normal first pregnancy, unspecified trimester: Secondary | ICD-10-CM

## 2017-02-18 ENCOUNTER — Encounter: Payer: Self-pay | Admitting: Obstetrics and Gynecology

## 2017-02-19 ENCOUNTER — Ambulatory Visit (INDEPENDENT_AMBULATORY_CARE_PROVIDER_SITE_OTHER): Payer: Medicaid Other | Admitting: *Deleted

## 2017-02-19 ENCOUNTER — Ambulatory Visit (INDEPENDENT_AMBULATORY_CARE_PROVIDER_SITE_OTHER): Payer: Medicaid Other | Admitting: Family Medicine

## 2017-02-19 ENCOUNTER — Ambulatory Visit: Payer: Self-pay

## 2017-02-19 VITALS — BP 120/69 | HR 82 | Wt 208.8 lb

## 2017-02-19 DIAGNOSIS — O24419 Gestational diabetes mellitus in pregnancy, unspecified control: Secondary | ICD-10-CM

## 2017-02-19 DIAGNOSIS — O9989 Other specified diseases and conditions complicating pregnancy, childbirth and the puerperium: Secondary | ICD-10-CM

## 2017-02-19 DIAGNOSIS — O09892 Supervision of other high risk pregnancies, second trimester: Secondary | ICD-10-CM

## 2017-02-19 DIAGNOSIS — O0932 Supervision of pregnancy with insufficient antenatal care, second trimester: Secondary | ICD-10-CM

## 2017-02-19 DIAGNOSIS — O26892 Other specified pregnancy related conditions, second trimester: Secondary | ICD-10-CM

## 2017-02-19 DIAGNOSIS — O0990 Supervision of high risk pregnancy, unspecified, unspecified trimester: Secondary | ICD-10-CM | POA: Diagnosis not present

## 2017-02-19 DIAGNOSIS — O3663X0 Maternal care for excessive fetal growth, third trimester, not applicable or unspecified: Secondary | ICD-10-CM

## 2017-02-19 DIAGNOSIS — Z3400 Encounter for supervision of normal first pregnancy, unspecified trimester: Secondary | ICD-10-CM

## 2017-02-19 DIAGNOSIS — Z789 Other specified health status: Secondary | ICD-10-CM

## 2017-02-19 DIAGNOSIS — Z2839 Other underimmunization status: Secondary | ICD-10-CM

## 2017-02-19 DIAGNOSIS — Z283 Underimmunization status: Secondary | ICD-10-CM

## 2017-02-19 DIAGNOSIS — Z6791 Unspecified blood type, Rh negative: Secondary | ICD-10-CM | POA: Diagnosis not present

## 2017-02-19 MED ORDER — PRENATAL VITAMIN PLUS LOW IRON 27-1 MG PO TABS: 1.0000 | ORAL_TABLET | Freq: Every day | ORAL | 5 refills | Status: DC

## 2017-02-19 MED ORDER — METFORMIN HCL 500 MG PO TABS: 500.0000 mg | ORAL_TABLET | Freq: Two times a day (BID) | ORAL | 1 refills | Status: DC

## 2017-02-19 MED ORDER — PRENATAL VITAMIN PLUS LOW IRON 27-1 MG PO TABS: 1.0000 | ORAL_TABLET | Freq: Every day | ORAL | 5 refills | Status: AC

## 2017-02-19 NOTE — Progress Notes (Signed)
US for growth/BPP scheduled on 2/18

## 2017-02-19 NOTE — Patient Instructions (Signed)
Evaluacin de los movimientos fetales  Fetal Movement Counts  Introduccin  Nombre del paciente: ________________________________________________ Fecha de parto estimada: ____________________  Qu es una evaluacin de los movimientos fetales?  Una evaluacin de los movimientos fetales es el registro del nmero de veces que siente que el beb se mueve durante un cierto perodo de tiempo. Esto tambin se puede denominar recuento de patadas fetales. Una evaluacin de movimientos fetales se recomienda a todas las embarazadas. Es posible que le indiquen que comience a evaluar los movimientos fetales desde la semana 28 de embarazo.  Preste atencin cuando sienta que el beb est ms activo. Podr detectar los ciclos en que el beb duerme y est despierto. Tambin podr detectar que ciertas cosas hacen que su beb se mueva ms. Deber realizar una evaluacin de los movimientos fetales en las siguientes situaciones:   Cuando el beb est ms activo habitualmente.   A la misma hora, todos los das.    Un buen momento para evaluar los movimientos fetales es cuando est descansando, despus de haber comido y bebido algo.  Cmo debo contar los movimientos fetales?  1. Encuentre un lugar tranquilo y cmodo. Sintese o acustese de lado.  2. Anote la fecha, la hora de inicio y de finalizacin y la cantidad de movimientos que sinti entre esas dos horas. Lleve esta informacin a las visitas de control.  3. Cuente las pataditas, revoloteos, chasquidos, vueltas o pinchazos en un perodo de 2horas. Debe sentir al menos 10movimientos en 2horas.  4. Cuando sienta 10movimientos, puede dejar de contar.  5. Si no siente 10movimientos en 2horas, coma y beba algo. Luego, contine descansando y contando durante 1hora. Si siente al menos 4movimientos durante esa hora, puede dejar de contar.  Comunquese con un mdico si:   Siente menos de 4movimientos en 2horas.   El beb no se mueve tanto como suele hacerlo.  Fecha:  ____________ Hora de inicio: ____________ Hora de finalizacin: ____________ Movimientos: ____________  Fecha: ____________ Hora de inicio: ____________ Hora de finalizacin: ____________ Movimientos: ____________  Fecha: ____________ Hora de inicio: ____________ Hora de finalizacin: ____________ Movimientos: ____________  Fecha: ____________ Hora de inicio: ____________ Hora de finalizacin: ____________ Movimientos: ____________  Fecha: ____________ Hora de inicio: ____________ Hora de finalizacin: ____________ Movimientos: ____________  Fecha: ____________ Hora de inicio: ____________ Hora de finalizacin: ____________ Movimientos: ____________  Fecha: ____________ Hora de inicio: ____________ Hora de finalizacin: ____________ Movimientos: ____________  Fecha: ____________ Hora de inicio: ____________ Hora de finalizacin: ____________ Movimientos: ____________  Fecha: ____________ Hora de inicio: ____________ Hora de finalizacin: ____________ Movimientos: ____________  Esta informacin no tiene como fin reemplazar el consejo del mdico. Asegrese de hacerle al mdico cualquier pregunta que tenga.  Document Released: 04/03/2007 Document Revised: 03/30/2016 Document Reviewed: 02/03/2015  Elsevier Interactive Patient Education  2018 [employer]

## 2017-02-19 NOTE — Progress Notes (Signed)

## 2017-02-19 NOTE — Progress Notes (Signed)
PRENATAL VISIT NOTE  Subjective:  Kristen Fowler is a 27 y.o. G1P0 at 8781w2d being seen today for ongoing prenatal care.  She is currently monitored for the following issues for this high-risk pregnancy and has Rubella non-immune status, antepartum; Supervision of high risk pregnancy, antepartum; Rh negative, antepartum, second trimester; Late prenatal care affecting pregnancy in second trimester; Gestational diabetes; and Large for gestational age fetus affecting management of mother on their problem list.  Patient reports no complaints.  Contractions: Not present. Vag. Bleeding: None.  Movement: Present. Denies leaking of fluid.   She did not bring her glucose log with her. She report blood sugars have been "good", and when asked specifically reports fasting have been 95, but has not been checking after meals. She reports she has not been taking metformin as pharmacy did not have prescription.   The following portions of the patient's history were reviewed and updated as appropriate: allergies, current medications, past family history, past medical history, past social history, past surgical history and problem list. Problem list updated.  Objective:   Vitals:   02/19/17 1147  BP: 120/69  Pulse: 82  Weight: 208 lb 12.8 oz (94.7 kg)    Fetal Status: Fetal Heart Rate (bpm): NST   Movement: Present     General:  Alert, oriented and cooperative. Patient is in no acute distress.  Skin: Skin is warm and dry. No rash noted.   Cardiovascular: Normal heart rate noted  Respiratory: Normal respiratory effort, no problems with respiration noted  Abdomen: Soft, gravid, appropriate for gestational age.  Pain/Pressure: Absent     Pelvic: Cervical exam deferred        Extremities: Normal range of motion.  Edema: None  Mental Status:  Normal mood and affect. Normal behavior. Normal judgment and thought content.   Assessment and Plan:  Pregnancy: G1P0 at 10481w2d  1. Supervision of high  risk pregnancy, antepartum Continue follow q week until delivery GBS negative  2. Gestational diabetes mellitus (GDM) in third trimester, gestational diabetes method of control unspecified Metformin 500 mg BID prescribed last week but patient did not start taking. Diane called pharmacy and confirmed that they have the prescription.  Not checking CBGs after meals BPP today  - Counseled extensively on importance of checking blood sugars fasting and 2-hour after meals. Counseled extensively on importance of glycemic control and risks for the baby if poor glycemic control. - Pt to pick up metformin prescription today and start taking - Advised to bring CBG log with her next week.  - F/u 1 week with BPP  4. Rubella non-immune status, antepartum Vaccinate postpartum  5. Rh negative, antepartum, second trimester Had Rhogam at 28 weeks Rhogam eval postaprtum  6. Excessive fetal growth affecting management of pregnancy in third trimester, single or unspecified fetus U/S at 34 weeks, EFW 3101g/6lb13oz, >90%, AC >97%ile - F/u growth U/S in 1 week - Again advised importance of glycemic control and risk of birth trauma  7. Late prenatal care affecting pregnancy in second trimester  8. Language barrier - Live Spanish interpreter used  Term labor symptoms and general obstetric precautions including but not limited to vaginal bleeding, contractions, leaking of fluid and fetal movement were reviewed in detail with the patient. Please refer to After Visit Summary for other counseling recommendations.  Return in about 6 days (around 02/25/2017) for as scheduled.   Frederik PearJulie P Encarnacion Bole, MD  Future Appointments  Date Time Provider Department Center  02/25/2017  9:15 AM [facility] NST  [facility]  02/25/2017 10:15 AM Conan Bowens, MD [facility]  02/25/2017 10:45 AM [facility] [place] 2 [facility]

## 2017-02-22 ENCOUNTER — Encounter (HOSPITAL_COMMUNITY): Payer: Self-pay | Admitting: *Deleted

## 2017-02-22 ENCOUNTER — Telehealth (HOSPITAL_COMMUNITY): Payer: Self-pay | Admitting: *Deleted

## 2017-02-22 NOTE — Telephone Encounter (Signed)
[phone] interpreter number Preadmission screen

## 2017-02-25 ENCOUNTER — Ambulatory Visit (INDEPENDENT_AMBULATORY_CARE_PROVIDER_SITE_OTHER): Payer: Medicaid Other | Admitting: Obstetrics and Gynecology

## 2017-02-25 ENCOUNTER — Encounter (HOSPITAL_COMMUNITY): Payer: Self-pay

## 2017-02-25 ENCOUNTER — Ambulatory Visit (INDEPENDENT_AMBULATORY_CARE_PROVIDER_SITE_OTHER): Payer: Medicaid Other | Admitting: *Deleted

## 2017-02-25 ENCOUNTER — Other Ambulatory Visit: Payer: Self-pay | Admitting: Family Medicine

## 2017-02-25 ENCOUNTER — Ambulatory Visit (HOSPITAL_COMMUNITY)
Admission: RE | Admit: 2017-02-25 | Discharge: 2017-02-25 | Disposition: A | Discharge status: Home / Self Care | Payer: Medicaid Other | Source: Ambulatory Visit | Attending: Family Medicine | Admitting: Family Medicine

## 2017-02-25 ENCOUNTER — Encounter: Payer: Self-pay | Admitting: Obstetrics and Gynecology

## 2017-02-25 VITALS — BP 119/72 | HR 97 | Wt 208.8 lb

## 2017-02-25 DIAGNOSIS — O0993 Supervision of high risk pregnancy, unspecified, third trimester: Secondary | ICD-10-CM

## 2017-02-25 DIAGNOSIS — O9989 Other specified diseases and conditions complicating pregnancy, childbirth and the puerperium: Secondary | ICD-10-CM | POA: Diagnosis not present

## 2017-02-25 DIAGNOSIS — O3663X0 Maternal care for excessive fetal growth, third trimester, not applicable or unspecified: Secondary | ICD-10-CM | POA: Insufficient documentation

## 2017-02-25 DIAGNOSIS — Z2839 Other underimmunization status: Secondary | ICD-10-CM

## 2017-02-25 DIAGNOSIS — Z3A38 38 weeks gestation of pregnancy: Secondary | ICD-10-CM | POA: Diagnosis not present

## 2017-02-25 DIAGNOSIS — O24415 Gestational diabetes mellitus in pregnancy, controlled by oral hypoglycemic drugs: Secondary | ICD-10-CM | POA: Insufficient documentation

## 2017-02-25 DIAGNOSIS — O0990 Supervision of high risk pregnancy, unspecified, unspecified trimester: Secondary | ICD-10-CM

## 2017-02-25 DIAGNOSIS — O24419 Gestational diabetes mellitus in pregnancy, unspecified control: Secondary | ICD-10-CM | POA: Diagnosis not present

## 2017-02-25 DIAGNOSIS — O26892 Other specified pregnancy related conditions, second trimester: Secondary | ICD-10-CM

## 2017-02-25 DIAGNOSIS — Z789 Other specified health status: Secondary | ICD-10-CM

## 2017-02-25 DIAGNOSIS — Z283 Underimmunization status: Secondary | ICD-10-CM

## 2017-02-25 DIAGNOSIS — Z6791 Unspecified blood type, Rh negative: Secondary | ICD-10-CM

## 2017-02-25 DIAGNOSIS — O09893 Supervision of other high risk pregnancies, third trimester: Secondary | ICD-10-CM

## 2017-02-25 NOTE — ED Notes (Signed)
Eda Royal present as interpreter. 

## 2017-02-25 NOTE — Patient Instructions (Signed)
Etonogestrel implant Qu es este medicamento? El ETONOGESTREL es un dispositivo anticonceptivo (control de la natalidad). Se usa para evitar los embarazos. Se puede usar hasta por 3 aos. Este medicamento puede ser utilizado para otros usos; si tiene alguna pregunta consulte con su proveedor de atencin mdica o con su farmacutico. MARCAS COMUNES: Implanon, Nexplanon Qu le debo informar a mi profesional de la salud antes de tomar este medicamento? Necesita saber si usted presenta alguno de los siguientes problemas o situaciones: sangrado vaginal anormal enfermedad vascular o cogulos sanguneos cncer de mama, cervical, heptico depresin diabetes enfermedad de la vescula biliar dolores de cabeza enfermedad cardiaca o ataque cardiaco reciente alta presin sangunea alto nivel de colesterol enfermedad renal enfermedad heptica convulsiones fuma tabaco una reaccin alrgica o inusual al etonogestrel, otras hormonas, anestsicos o antispticos, medicamentos, alimentos, colorantes o conservantes si est embarazada o buscando quedar embarazada si est amamantando a un beb Cmo debo utilizar este medicamento? Un profesional de la salud inserta este dispositivo justo debajo de la piel en la parte interior del brazo. Hable con su pediatra para informarse acerca del uso de este medicamento en nios. Puede requerir atencin especial. Sobredosis: Pngase en contacto inmediatamente con un centro toxicolgico o una sala de urgencia si usted cree que haya tomado demasiado medicamento. ATENCIN: Este medicamento es solo para usted. No comparta este medicamento con nadie. Qu sucede si me olvido de una dosis? No se aplica en este caso. Qu puede interactuar con este medicamento? No tome este medicamento con ninguno de los siguientes frmacos: amprenavir bosentano fosamprenavir Este medicamento tambin podra interactuar con los siguientes medicamentos: barbitricos para inducir el sueo o para el  tratamiento de convulsiones ciertos medicamentos para infecciones micticas, tales como itraconazol y ketoconazol jugo de toronja griseofulvina medicamentos para tratar convulsiones, tales como carbamazepina, felbamato, oxcarbazepina, fenitona, topiramato modafinilo fenilbutazona rifampicina rufinamida algunos medicamentos para tratar la infeccin por el VIH, tales como atazanavir, indinavir, lopinavir, nelfinavir, tipranavir, ritonavir hierba de [place] Puede ser que esta lista no menciona todas las posibles interacciones. Informe a su profesional de la salud de todos los productos a base de hierbas, medicamentos de venta libre o suplementos nutritivos que est tomando. Si usted fuma, consume bebidas alcohlicas o si utiliza drogas ilegales, indqueselo tambin a su profesional de la salud. Algunas sustancias pueden interactuar con su medicamento. A qu debo estar atento al usar este medicamento? Este producto no protege contra la infeccin por el VIH (SIDA) u otras enfermedades de transmisin sexual. Usted debe sentir el implante al presionar con las yemas de los dedos sobre la piel donde se insert. Contacte a su mdico si no se siente el implante y usa un mtodo anticonceptivo no hormonal (como el condn) hasta que el mdico confirma que el implante est en su lugar. Si siente que el implante puede haber roto o doblado en su brazo, pngase en contacto con su proveedor de atencin mdica. Qu efectos secundarios puedo tener al utilizar este medicamento? Efectos secundarios que debe informar a su mdico o a su profesional de la salud tan pronto como sea posible: reacciones alrgicas, como erupcin cutnea, picazn o urticarias, e hinchazn de la cara, los labios o la lengua bultos en las mamas cambios en las emociones o el estado de nimo estado de nimo deprimido sangrado menstrual intenso o prolongado dolor, irritacin, hinchazn o moretones en el lugar de la insercin cicatriz en el lugar de la  insercin signos de infeccin en el sitio de insercin tales como fiebre, y enrojecimiento de   la piel, dolor o secrecin signos de Psychiatrist signos y sntomas de un cogulo sanguneo, tales como problemas respiratorios; cambios en la visin; dolor en el pecho; dolor de cabeza severo, repentino; dolor, hinchazn, calor en la pierna; dificultad para hablar; entumecimiento o debilidad repentina de la cara, el brazo o la pierna signos y sntomas de lesin al hgado, como orina amarilla oscura o [place]; sensacin general de estar enfermo o sntomas gripales; heces claras; prdida de apetito; nuseas; dolor en la regin abdominal superior derecha; cansancio o debilidad inusual; color amarillento de los ojos o la piel sangrado vaginal inusual, secrecin signos y sntomas de un derrame cerebral, tales como cambios en la visin; confusin; dificultad para hablar o entender; dolores de cabeza severos; entumecimiento o debilidad repentina de la cara, el brazo o la pierna; problemas al Advertising account planner; Psychiatrist; prdida del equilibrio o la coordinacin Efectos secundarios que generalmente no requieren Psychologist, prison and probation services (infrmelos a su mdico o a Producer, television/film/video de la salud si persisten o si son molestos): acn dolor de Retail buyer en las mamas cambios de peso mareos sensacin general de estar enfermo o sntomas gripales dolor de cabeza sangrado menstrual irregular nuseas dolor de garganta irritacin o inflamacin vaginal Puede ser que esta lista no menciona todos los posibles efectos secundarios. Comunquese a su mdico por asesoramiento mdico [employer]. Usted puede informar los efectos secundarios a la FDA por telfono al 1-800-FDA-1088. Dnde debo guardar mi medicina? Este medicamento se administra en hospitales o clnicas y no necesitar guardarlo en su domicilio. ATENCIN: Este folleto es un resumen. Puede ser que no cubra toda la posible informacin. Si usted tiene preguntas acerca de esta medicina,  consulte con su mdico, su farmacutico o su profesional de Radiographer, therapeutic.  2018 Elsevier/Gold Standard (2016-01-26 00:00:00) Informacin sobre el dispositivo intrauterino (Intrauterine Device Information) Un dispositivo intrauterino (DIU) se inserta en el tero e impide el embarazo. Hay dos tipos de DIU:  DIU de cobre: este tipo de DIU est recubierto con un alambre de cobre y se inserta dentro del tero. El cobre hace que el tero y las trompas de [place] produzcan un liquido que [employer] espermatozoides. El DIU de cobre puede Geneticist, molecular durante 10 aos.  DIU con hormona: este tipo de DIU contiene la hormona progestina (progesterona sinttica). Las hormonas hacen que el moco cervical se haga ms espeso, lo que evita que el esperma ingrese al tero. Tambin hace que la membrana que recubre internamente al tero sea ms delgada lo que impide el implante del vulo fertilizado. La hormona debilita o destruye los espermatozoides que ingresan al tero. Alguno de los tipos de DIU hormonal pueden Geneticist, molecular durante 5 aos y otros tipos pueden dejarse en el lugar por 3 aos. El mdico se asegurar de que usted sea una buena candidata para usar el DIU. Converse con su mdico acerca de los posibles efectos secundarios. VENTAJASDEL DISPOSITIVO INTRAUTERINO  El DIU es muy eficaz, reversible, de accin prolongada y de bajo mantenimiento.  No hay efectos secundarios relacionados con el estrgeno.  El DIU puede ser utilizado durante la Market researcher.  No est asociado con el aumento de [place].  Funciona inmediatamente despus de la insercin.  El DIU hormonal funciona inmediatamente si se inserta dentro de los [address] del inicio del perodo. Ser necesario que utilice un mtodo anticonceptivo adicional durante 7 das si el DIU hormonal se inserta en algn otro momento del ciclo.  El DIU de cobre no interfiere con las  hormonas femeninas.  El DIU hormonal puede hacer que los perodos  menstruales abundantes se hagan ms ligeros y que haya menos clicos.  El DIU hormonal puede usarse durante 3 a 5 aos.  El DIU de cobre puede usarse durante 10 aos.  DESVENTAJASDEL DISPOSITIVO Tesoro CorporationNTRAUTERINO  El DIU hormonal puede estar asociado con patrones de sangrado irregular.  El DIU de cobre puede hacer que el flujo menstrual ms abundante y doloroso.  Puede experimentar clicos y sangrado vaginal despus de la insercin.  Esta informacin no tiene Theme park managercomo fin reemplazar el consejo del mdico. Asegrese de hacerle al mdico cualquier pregunta que tenga. Document Released: 06/14/2009 Document Revised: 04/18/2015 Document Reviewed: 06/15/2012 Elsevier Interactive Patient Education  2017 [employer]

## 2017-02-25 NOTE — Progress Notes (Signed)
   PRENATAL VISIT NOTE  Subjective:  Kristen Fowler is a 27 y.o. G1P0 at 57w1dbeing seen today for ongoing prenatal care.  She is currently monitored for the following issues for this high-risk pregnancy and has Rubella non-immune status, antepartum; Supervision of high risk pregnancy, antepartum; Rh negative, antepartum, second trimester; Late prenatal care affecting pregnancy in second trimester; Gestational diabetes; Large for gestational age fetus affecting management of mother; and Language barrier on their problem list.  Patient reports no complaints.  Contractions: Irregular. Vag. Bleeding: None.  Movement: Present. Denies leaking of fluid.   GDMA2, prescribed metformin 500 mg BID, started taking last week FG: 95 PP: 120-123  The following portions of the patient's history were reviewed and updated as appropriate: allergies, current medications, past family history, past medical history, past social history, past surgical history and problem list. Problem list updated.  Objective:   Vitals:   02/25/17 0947  BP: 119/72  Pulse: 97  Weight: 208 lb 12.8 oz (94.7 kg)    Fetal Status: Fetal Heart Rate (bpm): NST   Movement: Present     General:  Alert, oriented and cooperative. Patient is in no acute distress.  Skin: Skin is warm and dry. No rash noted.   Cardiovascular: Normal heart rate noted  Respiratory: Normal respiratory effort, no problems with respiration noted  Abdomen: Soft, gravid, appropriate for gestational age.  Pain/Pressure: Absent     Pelvic: Cervical exam deferred        Extremities: Normal range of motion.  Edema: None  Mental Status:  Normal mood and affect. Normal behavior. Normal judgment and thought content.   Assessment and Plan:  Pregnancy: G1P0 at 384w1d1. Supervision of high risk pregnancy, antepartum IOL scheduled for 03/03/17  2. Gestational diabetes mellitus (GDM) in third trimester, gestational diabetes method of control  unspecified patient started on metformin 500 mg BID 2 weeks ago, started last week CBGs much better controlled today, cont current regimen NST reactive BPP/growth today  3. Rubella non-immune status, antepartum MMR pp  4. Rh negative, antepartum, second trimester Fetal screen pp  5. Excessive fetal growth affecting management of pregnancy in third trimester, single or unspecified fetus Last growth 01/30/17 >90th%tile  6. Language barrier SpPatent attorneysed   Preterm labor symptoms and general obstetric precautions including but not limited to vaginal bleeding, contractions, leaking of fluid and fetal movement were reviewed in detail with the patient. Please refer to After Visit Summary for other counseling recommendations.  Return in about 5 weeks (around 04/01/2017) for PP visit.  IOL on 2/24.   KeSloan LeiterMD

## 2017-02-25 NOTE — Addendum Note (Signed)
Encounter addended by: Marcellina MillinMoser, Dalphine Cowie L, RTR on: 02/25/2017 12:32 PM  Actions taken: Imaging Exam ended

## 2017-02-25 NOTE — Progress Notes (Signed)
Live interpreter Viviana Simplerlis Herrera present for encounter.  US for growth and BPP today @ 1045. IOL scheduled 2/24 @ midnight.

## 2017-02-26 ENCOUNTER — Telehealth: Payer: Self-pay | Admitting: *Deleted

## 2017-02-26 NOTE — Telephone Encounter (Signed)
Called pt w/interpreter Kristen RiggsMariel Fowler. I informed pt that we would like to see her in office tomorrow in order for the doctor to discuss US results from yesterday. There is no emergency however her baby is large and the doctor needs to discuss her options for delivery of the baby. Pt voiced understanding and agreed to appt tomorrow @ 0915.

## 2017-02-27 ENCOUNTER — Ambulatory Visit (INDEPENDENT_AMBULATORY_CARE_PROVIDER_SITE_OTHER): Payer: Medicaid Other | Admitting: Obstetrics & Gynecology

## 2017-02-27 ENCOUNTER — Other Ambulatory Visit: Payer: Self-pay | Admitting: Family Medicine

## 2017-02-27 ENCOUNTER — Other Ambulatory Visit: Payer: Self-pay | Admitting: Advanced Practice Midwife

## 2017-02-27 VITALS — BP 121/71 | HR 80 | Wt 211.2 lb

## 2017-02-27 DIAGNOSIS — O24419 Gestational diabetes mellitus in pregnancy, unspecified control: Secondary | ICD-10-CM | POA: Diagnosis not present

## 2017-02-27 DIAGNOSIS — O3663X0 Maternal care for excessive fetal growth, third trimester, not applicable or unspecified: Secondary | ICD-10-CM

## 2017-02-27 NOTE — Progress Notes (Signed)
Spanish interpreter used for visit 

## 2017-02-27 NOTE — Patient Instructions (Signed)
Induccin del trabajo de parto  (Labor Induction)  Se denomina induccin del trabajo de parto cuando se inician acciones para hacer que una mujer embarazada comience el trabajo de parto. La mayora de las mujeres comienzan el trabajo de parto sin ayuda entre las semanas 37 y 42 del embarazo. Cuando esto no ocurre o cuando hay una necesidad mdica, pueden utilizarse diferentes mtodos para inducirlo. La induccin del trabajo de parto hace que el tero se contraiga. Tambin hace que el cuello del tero se ablandemadure), se abra (se dilate), y se afine (se borre). Generalmente el trabajo de parto no se induce antes de las 39 semanas excepto que haya un problema con el beb o con la madre.  Antes de inducir el trabajo de parto, el mdico considerar cierto nmero de factores incluyendo los siguientes:   El estado del beb.   Cuntas semanas tiene de embarazo.   La madurez de los pulmones del beb.   El estado del cuello del tero.   La posicin del beb.  CULES SON LOS MOTIVOS PARA INDUCIR UN PARTO?  El trabajo de parto puede inducirse por las siguientes razones:   La salud del beb o de la madre estn en riesgo.   El embarazo se ha pasado de trmino en 1 semana o ms.   Ha roto la bolsa de aguas pero no se ha iniciado el trabajo de parto por s mismo.   La madre tiene algn trastorno de salud o una enfermedad grave, como hipertensin arterial, una infeccin, desprendimiento abrupto de la placenta o diabetes.   Hay escaso lquido amnitico alrededor del beb.   El beb presenta sufrimiento.  La conveniencia o el deseo de que el beb nazca en una cierta fecha no es un motivo para inducir el parto.  CULES SON LOS MTODOS UTILIZADOS PARA INDUCIR EL TRABAJO DE PARTO?  Algunos mtodos de induccin del trabajo de parto son:   Administracin del medicamentos prostaglandina. Este medicamento hace que el cuello uterino se dilate y madure. Este medicamento tambin iniciar las contracciones. Puede tomarse por  boca o insertarse en la vagina en forma de supositorio.   Insercin en la vagina de un tubo delgado (catter) con un baln en el extremo para dilatar el cuello del tero. Una vez insertado, el baln se infla con agua, lo que provoca la apertura del cuello del tero.   Ruptura de las membranas. El mdico separa el saco amnitico del cuello uterino, haciendo que el cuello uterino se distienda y cause la liberacin de la hormona llamada progesterona. Esto hace que el tero se contraiga. Este procedimiento se realiza durante una visita al consultorio mdico. Le indicarn que vuelva a su casa y espere que se inicien las contracciones. Luego tendr que volver para la induccin.   Ruptura de la bolsa de aguas. El mdico romper el saco amnitico con un pequeo instrumento. Una vez que el saco amnitico se rompe, las contracciones deben comenzar. Pueden pasar algunas horas hasta que haga efecto.   Medicamentos que desencadenen o intensifiquen las contracciones. Se lo administrarn a travs de un catter por va intravenosa (IV) que se inserta en una de las venas del brazo.  Todos los mtodos de induccin, excepto la ruptura de membranas, se realizan en el hospital. La induccin se realizar en el hospital, de modo que usted y el beb puedan ser controlados cuidadosamente.  CUNTO TIEMPO LLEVA INDUCIR EL TRABAJO DE PARTO?  Algunas inducciones pueden demorar entre 2 y 3 das. Generalmente lleva menos   tiempo, dependiendo del estado del cuello del tero. Puede tomar ms tiempo si la induccin se realiza en etapas tempranas del embarazo o es su primer embarazo. Si han pasado 2 o 3 das y no se inicia el trabajo de parto, podrn enviarla a su casa o realizar una cesrea.  CULES SON LOS RIESGOS ASOCIADOS CON LA INDUCCiN DEL TRABAJO DE PARTO?  Algunos de los riesgos de la induccin son:   Cambios en la frecuencia cardaca fetal, por ejemplo los latidos son demasiado rpidos, o lentos, o errticos.   Riesgo de distrs  fetal.   Posibilidad de infeccin en la madre o el beb.   Aumento de la posibilidad de que sea necesaria una cesrea.   Ruptura (abrupcin) de la placenta del tero (raro).   Ruptura uterina (muy raro).  Cuando es necesario realizar la induccin por razones mdicas, los beneficios deben superar a los riesgos.  CULES SON ALGUNAS RAZONES PARA NO INDUCIR EL TRABAJO DE PARTO?  La induccin no debe realizarse si:   Se demuestra que el beb no tolera el trabajo de parto.   Fue sometida anteriormente a cirugas en el tero, como una miomectoma o le han extirpado fibromas.   La placenta est en una posicin muy baja en el tero y obstruye la abertura del cuello (placenta previa).   El beb no est ubicado con la cabeza hacia bajo.   El cordn umbilical cae hacia el canal de parto, adelante del beb. Esto puede cortar el suministro de sangre y oxgeno al beb.   Fue sometida a una cesrea anteriormente.   Hay circunstancias poco habituales, como que el beb es extremadamente prematuro.  Esta informacin no tiene como fin reemplazar el consejo del mdico. Asegrese de hacerle al mdico cualquier pregunta que tenga.  Document Released: 04/03/2007 Document Revised: 04/18/2015 Document Reviewed: 07/24/2012  Elsevier Interactive Patient Education  2017 Elsevier Inc.

## 2017-02-27 NOTE — Progress Notes (Signed)
   PRENATAL VISIT NOTE  Subjective:  Kristen Fowler is a 27 y.o. G1P0 at 3974w3d being seen today for ongoing prenatal care.  She is currently monitored for the following issues for this high-risk pregnancy and has Rubella non-immune status, antepartum; Supervision of high risk pregnancy, antepartum; Rh negative, antepartum, second trimester; Late prenatal care affecting pregnancy in second trimester; Gestational diabetes; Large for gestational age fetus affecting management of mother; and Language barrier on their problem list.  Patient reports no complaints.  Contractions: Not present. Vag. Bleeding: None.  Movement: Present. Denies leaking of fluid.   The following portions of the patient's history were reviewed and updated as appropriate: allergies, current medications, past family history, past medical history, past social history, past surgical history and problem list. Problem list updated.  Objective:   Vitals:   02/27/17 0929  BP: 121/71  Pulse: 80  Weight: 211 lb 3.2 oz (95.8 kg)    Fetal Status: Fetal Heart Rate (bpm): 143   Movement: Present     General:  Alert, oriented and cooperative. Patient is in no acute distress.  Skin: Skin is warm and dry. No rash noted.   Cardiovascular: Normal heart rate noted  Respiratory: Normal respiratory effort, no problems with respiration noted  Abdomen: Soft, gravid, appropriate for gestational age.  Pain/Pressure: Absent     Pelvic: Cervical exam performed        Extremities: Normal range of motion.  Edema: None  Mental Status:  Normal mood and affect. Normal behavior. Normal judgment and thought content.   Assessment and Plan:  Pregnancy: G1P0 at 274w3d  1. Excessive fetal growth affecting management of pregnancy in third trimester, single or unspecified fetus Discussed suspected macrosomia and offered primary CS vs. IOL and she is requesting IOL   2. Gestational diabetes mellitus (GDM) in third trimester, gestational diabetes  method of control unspecified States good BG control  Term labor symptoms and general obstetric precautions including but not limited to vaginal bleeding, contractions, leaking of fluid and fetal movement were reviewed in detail with the patient. Please refer to After Visit Summary for other counseling recommendations.  Return if symptoms worsen or fail to improve, for postpartum.   Scheryl DarterJames Arnold, MD

## 2017-03-01 ENCOUNTER — Encounter: Payer: Self-pay | Admitting: *Deleted

## 2017-03-02 ENCOUNTER — Inpatient Hospital Stay (HOSPITAL_COMMUNITY)
Admission: AD | Admit: 2017-03-02 | Discharge: 2017-03-02 | Discharge status: Absent without leave | Payer: Self-pay | Attending: Obstetrics & Gynecology | Admitting: Obstetrics & Gynecology

## 2017-03-03 ENCOUNTER — Inpatient Hospital Stay (HOSPITAL_COMMUNITY): Payer: Medicaid Other | Admitting: Anesthesiology

## 2017-03-03 ENCOUNTER — Inpatient Hospital Stay (HOSPITAL_COMMUNITY)
Admission: AD | Admit: 2017-03-03 | Discharge: 2017-03-05 | DRG: 788 | Disposition: A | Discharge status: Home / Self Care | Payer: Medicaid Other | Source: Ambulatory Visit | Attending: Obstetrics & Gynecology | Admitting: Obstetrics & Gynecology

## 2017-03-03 ENCOUNTER — Inpatient Hospital Stay (HOSPITAL_COMMUNITY)
Admission: RE | Admit: 2017-03-03 | Discharge: 2017-03-03 | Disposition: A | Discharge status: Home / Self Care | Payer: Medicaid Other | Source: Ambulatory Visit | Attending: Obstetrics and Gynecology | Admitting: Obstetrics and Gynecology

## 2017-03-03 ENCOUNTER — Encounter (HOSPITAL_COMMUNITY)
Admission: AD | Disposition: A | Discharge status: Home / Self Care | Payer: Self-pay | Source: Ambulatory Visit | Attending: Obstetrics & Gynecology

## 2017-03-03 ENCOUNTER — Other Ambulatory Visit: Payer: Self-pay

## 2017-03-03 ENCOUNTER — Encounter (HOSPITAL_COMMUNITY): Payer: Self-pay

## 2017-03-03 DIAGNOSIS — O24419 Gestational diabetes mellitus in pregnancy, unspecified control: Secondary | ICD-10-CM

## 2017-03-03 DIAGNOSIS — Z3A39 39 weeks gestation of pregnancy: Secondary | ICD-10-CM

## 2017-03-03 DIAGNOSIS — O9989 Other specified diseases and conditions complicating pregnancy, childbirth and the puerperium: Secondary | ICD-10-CM

## 2017-03-03 DIAGNOSIS — O3663X0 Maternal care for excessive fetal growth, third trimester, not applicable or unspecified: Principal | ICD-10-CM | POA: Diagnosis present

## 2017-03-03 DIAGNOSIS — O26892 Other specified pregnancy related conditions, second trimester: Secondary | ICD-10-CM

## 2017-03-03 DIAGNOSIS — Z2839 Other underimmunization status: Secondary | ICD-10-CM

## 2017-03-03 DIAGNOSIS — Z6791 Unspecified blood type, Rh negative: Secondary | ICD-10-CM

## 2017-03-03 DIAGNOSIS — Z283 Underimmunization status: Secondary | ICD-10-CM

## 2017-03-03 DIAGNOSIS — O26893 Other specified pregnancy related conditions, third trimester: Secondary | ICD-10-CM | POA: Diagnosis present

## 2017-03-03 DIAGNOSIS — O0932 Supervision of pregnancy with insufficient antenatal care, second trimester: Secondary | ICD-10-CM

## 2017-03-03 DIAGNOSIS — Z789 Other specified health status: Secondary | ICD-10-CM | POA: Diagnosis present

## 2017-03-03 DIAGNOSIS — O3660X0 Maternal care for excessive fetal growth, unspecified trimester, not applicable or unspecified: Secondary | ICD-10-CM | POA: Diagnosis present

## 2017-03-03 DIAGNOSIS — O24425 Gestational diabetes mellitus in childbirth, controlled by oral hypoglycemic drugs: Secondary | ICD-10-CM | POA: Diagnosis present

## 2017-03-03 DIAGNOSIS — Z98891 History of uterine scar from previous surgery: Secondary | ICD-10-CM

## 2017-03-03 DIAGNOSIS — Z603 Acculturation difficulty: Secondary | ICD-10-CM | POA: Diagnosis present

## 2017-03-03 DIAGNOSIS — O0990 Supervision of high risk pregnancy, unspecified, unspecified trimester: Secondary | ICD-10-CM

## 2017-03-03 LAB — CBC
HCT: 36.8 % (ref 36.0–46.0)
HEMOGLOBIN: 12.1 g/dL (ref 12.0–15.0)
MCH: 28.5 pg (ref 26.0–34.0)
MCHC: 32.9 g/dL (ref 30.0–36.0)
MCV: 86.6 fL (ref 78.0–100.0)
PLATELETS: 154 10*3/uL (ref 150–400)
RBC: 4.25 MIL/uL (ref 3.87–5.11)
RDW: 14.4 % (ref 11.5–15.5)
WBC: 9.1 10*3/uL (ref 4.0–10.5)

## 2017-03-03 LAB — CREATININE, SERUM
Creatinine, Ser: 0.58 mg/dL (ref 0.44–1.00)
GFR calc Af Amer: >60 mL/min (ref >60)
GFR calc non Af Amer: >60 mL/min (ref >60)

## 2017-03-03 LAB — GLUCOSE, CAPILLARY
GLUCOSE-CAPILLARY: 82 mg/dL (ref 65–99)
Glucose-Capillary: 79 mg/dL (ref 65–99)

## 2017-03-03 LAB — ABO/RH: ABO/RH(D): AB NEG

## 2017-03-03 LAB — TYPE AND SCREEN
ABO/RH(D): AB NEG
Antibody Screen: NEGATIVE

## 2017-03-03 LAB — RPR: RPR: NONREACTIVE

## 2017-03-03 SURGERY — Surgical Case
Anesthesia: Spinal | Site: Abdomen | Wound class: Clean Contaminated

## 2017-03-03 MED ORDER — LIDOCAINE HCL (PF) 1 % IJ SOLN: 30.0000 mL | INTRAMUSCULAR | Status: DC | PRN

## 2017-03-03 MED ORDER — OXYCODONE-ACETAMINOPHEN 5-325 MG PO TABS: 1.0000 | ORAL_TABLET | ORAL | Status: DC | PRN

## 2017-03-03 MED ORDER — DIPHENHYDRAMINE HCL 50 MG/ML IJ SOLN: 12.5000 mg | INTRAMUSCULAR | Status: DC | PRN

## 2017-03-03 MED ORDER — NALBUPHINE HCL 10 MG/ML IJ SOLN: 5.0000 mg | Freq: Once | INTRAMUSCULAR | Status: DC | PRN

## 2017-03-03 MED ORDER — SOD CITRATE-CITRIC ACID 500-334 MG/5ML PO SOLN: 30.0000 mL | ORAL | Status: DC | PRN

## 2017-03-03 MED ORDER — PHENYLEPHRINE 8 MG IN D5W 100 ML (0.08MG/ML) PREMIX OPTIME
INJECTION | INTRAVENOUS | Status: AC
  Filled 2017-03-03: qty 100

## 2017-03-03 MED ORDER — LIDOCAINE HCL (PF) 1 % IJ SOLN: INTRAMUSCULAR | Status: DC | PRN

## 2017-03-03 MED ORDER — TERBUTALINE SULFATE 1 MG/ML IJ SOLN: 0.2500 mg | Freq: Once | INTRAMUSCULAR | Status: DC | PRN

## 2017-03-03 MED ORDER — OXYTOCIN 10 UNIT/ML IJ SOLN
INTRAVENOUS | Status: DC | PRN
  Administered 2017-03-03: 40 [IU] via INTRAVENOUS

## 2017-03-03 MED ORDER — OXYTOCIN BOLUS FROM INFUSION: 500.0000 mL | Freq: Once | INTRAVENOUS | Status: DC

## 2017-03-03 MED ORDER — SCOPOLAMINE 1 MG/3DAYS TD PT72
MEDICATED_PATCH | TRANSDERMAL | Status: DC | PRN
  Administered 2017-03-03: 1 via TRANSDERMAL

## 2017-03-03 MED ORDER — DEXAMETHASONE SODIUM PHOSPHATE 10 MG/ML IJ SOLN
INTRAMUSCULAR | Status: AC
  Filled 2017-03-03: qty 1

## 2017-03-03 MED ORDER — SODIUM BICARBONATE 8.4 % IV SOLN
INTRAVENOUS | Status: DC | PRN
  Administered 2017-03-03: 20 mL via PERINEURAL

## 2017-03-03 MED ORDER — ONDANSETRON HCL 4 MG/2ML IJ SOLN
4.0000 mg | Freq: Three times a day (TID) | INTRAMUSCULAR | Status: DC | PRN
Start: 2017-03-03 — End: 2017-03-05

## 2017-03-03 MED ORDER — ZOLPIDEM TARTRATE 5 MG PO TABS: 5.0000 mg | ORAL_TABLET | Freq: Every evening | ORAL | Status: DC | PRN

## 2017-03-03 MED ORDER — PHENYLEPHRINE HCL 10 MG/ML IJ SOLN
INTRAMUSCULAR | Status: DC | PRN
  Administered 2017-03-03 (×3): 80 ug via INTRAVENOUS

## 2017-03-03 MED ORDER — OXYCODONE-ACETAMINOPHEN 5-325 MG PO TABS: 2.0000 | ORAL_TABLET | ORAL | Status: DC | PRN

## 2017-03-03 MED ORDER — MORPHINE SULFATE (PF) 0.5 MG/ML IJ SOLN
INTRAMUSCULAR | Status: AC
  Filled 2017-03-03: qty 10

## 2017-03-03 MED ORDER — CEFAZOLIN SODIUM-DEXTROSE 2-4 GM/100ML-% IV SOLN
2.0000 g | INTRAVENOUS | Status: AC
  Administered 2017-03-03: 2 g via INTRAVENOUS

## 2017-03-03 MED ORDER — MORPHINE SULFATE (PF) 0.5 MG/ML IJ SOLN
INTRAMUSCULAR | Status: DC | PRN
  Administered 2017-03-03: .2 mg via INTRATHECAL

## 2017-03-03 MED ORDER — SIMETHICONE 80 MG PO CHEW
80.0000 mg | CHEWABLE_TABLET | Freq: Three times a day (TID) | ORAL | Status: DC
  Administered 2017-03-04 – 2017-03-05 (×2): 80 mg via ORAL
  Filled 2017-03-03 (×3): qty 1

## 2017-03-03 MED ORDER — SCOPOLAMINE 1 MG/3DAYS TD PT72: 1.0000 | MEDICATED_PATCH | Freq: Once | TRANSDERMAL | Status: DC

## 2017-03-03 MED ORDER — LACTATED RINGERS IV BOLUS (SEPSIS)
500.0000 mL | Freq: Once | INTRAVENOUS | Status: AC
  Administered 2017-03-03: 500 mL via INTRAVENOUS

## 2017-03-03 MED ORDER — PHENYLEPHRINE 8 MG IN D5W 100 ML (0.08MG/ML) PREMIX OPTIME
INJECTION | INTRAVENOUS | Status: DC | PRN
  Administered 2017-03-03: 60 ug/min via INTRAVENOUS

## 2017-03-03 MED ORDER — SOD CITRATE-CITRIC ACID 500-334 MG/5ML PO SOLN
30.0000 mL | ORAL | Status: AC
  Administered 2017-03-03: 30 mL via ORAL
  Filled 2017-03-03: qty 15

## 2017-03-03 MED ORDER — BUPIVACAINE IN DEXTROSE 0.75-8.25 % IT SOLN
INTRATHECAL | Status: DC | PRN
  Administered 2017-03-03: 1.6 mL via INTRATHECAL

## 2017-03-03 MED ORDER — NALBUPHINE HCL 10 MG/ML IJ SOLN: 5.0000 mg | INTRAMUSCULAR | Status: DC | PRN

## 2017-03-03 MED ORDER — SCOPOLAMINE 1 MG/3DAYS TD PT72
MEDICATED_PATCH | TRANSDERMAL | Status: AC
  Filled 2017-03-03: qty 1

## 2017-03-03 MED ORDER — ACETAMINOPHEN 325 MG PO TABS: 650.0000 mg | ORAL_TABLET | ORAL | Status: DC | PRN

## 2017-03-03 MED ORDER — KETOROLAC TROMETHAMINE 30 MG/ML IJ SOLN
30.0000 mg | Freq: Four times a day (QID) | INTRAMUSCULAR | Status: AC | PRN
  Administered 2017-03-03: 30 mg via INTRAMUSCULAR

## 2017-03-03 MED ORDER — FENTANYL CITRATE (PF) 100 MCG/2ML IJ SOLN: 100.0000 ug | INTRAMUSCULAR | Status: DC | PRN

## 2017-03-03 MED ORDER — NALOXONE HCL 0.4 MG/ML IJ SOLN: 0.4000 mg | INTRAMUSCULAR | Status: DC | PRN

## 2017-03-03 MED ORDER — MEPERIDINE HCL 25 MG/ML IJ SOLN: 6.2500 mg | INTRAMUSCULAR | Status: DC | PRN

## 2017-03-03 MED ORDER — COCONUT OIL OIL: 1.0000 "application " | TOPICAL_OIL | Status: DC | PRN

## 2017-03-03 MED ORDER — DIBUCAINE 1 % RE OINT: 1.0000 "application " | TOPICAL_OINTMENT | RECTAL | Status: DC | PRN

## 2017-03-03 MED ORDER — OXYTOCIN 40 UNITS IN LACTATED RINGERS INFUSION - SIMPLE MED: 2.5000 [IU]/h | INTRAVENOUS | Status: DC

## 2017-03-03 MED ORDER — ONDANSETRON HCL 4 MG/2ML IJ SOLN
INTRAMUSCULAR | Status: DC | PRN
  Administered 2017-03-03: 4 mg via INTRAVENOUS

## 2017-03-03 MED ORDER — OXYTOCIN 10 UNIT/ML IJ SOLN
INTRAMUSCULAR | Status: AC
  Filled 2017-03-03: qty 4

## 2017-03-03 MED ORDER — IBUPROFEN 600 MG PO TABS
600.0000 mg | ORAL_TABLET | Freq: Four times a day (QID) | ORAL | Status: DC
  Administered 2017-03-04 – 2017-03-05 (×7): 600 mg via ORAL
  Filled 2017-03-03 (×7): qty 1

## 2017-03-03 MED ORDER — SENNOSIDES-DOCUSATE SODIUM 8.6-50 MG PO TABS
2.0000 | ORAL_TABLET | ORAL | Status: DC
  Administered 2017-03-04 (×2): 2 via ORAL
  Filled 2017-03-03 (×2): qty 2

## 2017-03-03 MED ORDER — PRENATAL MULTIVITAMIN CH
1.0000 | ORAL_TABLET | Freq: Every day | ORAL | Status: DC
  Administered 2017-03-04 – 2017-03-05 (×2): 1 via ORAL
  Filled 2017-03-03 (×2): qty 1

## 2017-03-03 MED ORDER — MISOPROSTOL 50MCG HALF TABLET
50.0000 ug | ORAL_TABLET | ORAL | Status: DC | PRN
  Administered 2017-03-03: 50 ug via BUCCAL
  Filled 2017-03-03: qty 1

## 2017-03-03 MED ORDER — MENTHOL 3 MG MT LOZG: 1.0000 | LOZENGE | OROMUCOSAL | Status: DC | PRN

## 2017-03-03 MED ORDER — LACTATED RINGERS IV SOLN
INTRAVENOUS | Status: DC
  Administered 2017-03-03: 01:00:00 via INTRAVENOUS
  Administered 2017-03-03: 125 mL/h via INTRAVENOUS

## 2017-03-03 MED ORDER — SIMETHICONE 80 MG PO CHEW: 80.0000 mg | CHEWABLE_TABLET | ORAL | Status: DC | PRN

## 2017-03-03 MED ORDER — LACTATED RINGERS IV SOLN
INTRAVENOUS | Status: DC | PRN
  Administered 2017-03-03 (×2): via INTRAVENOUS

## 2017-03-03 MED ORDER — OXYTOCIN 40 UNITS IN LACTATED RINGERS INFUSION - SIMPLE MED: 2.5000 [IU]/h | INTRAVENOUS | Status: AC

## 2017-03-03 MED ORDER — ONDANSETRON HCL 4 MG/2ML IJ SOLN
INTRAMUSCULAR | Status: AC
  Filled 2017-03-03: qty 2

## 2017-03-03 MED ORDER — FENTANYL 2.5 MCG/ML BUPIVACAINE 1/10 % EPIDURAL INFUSION (WH - ANES): INTRAMUSCULAR | Status: DC | PRN

## 2017-03-03 MED ORDER — LACTATED RINGERS IV SOLN: 500.0000 mL | INTRAVENOUS | Status: DC | PRN

## 2017-03-03 MED ORDER — TETANUS-DIPHTH-ACELL PERTUSSIS 5-2.5-18.5 LF-MCG/0.5 IM SUSP: 0.5000 mL | Freq: Once | INTRAMUSCULAR | Status: DC

## 2017-03-03 MED ORDER — DIPHENHYDRAMINE HCL 25 MG PO CAPS
25.0000 mg | ORAL_CAPSULE | Freq: Four times a day (QID) | ORAL | Status: DC | PRN
Start: 2017-03-03 — End: 2017-03-05

## 2017-03-03 MED ORDER — LACTATED RINGERS IV SOLN
INTRAVENOUS | Status: DC
  Administered 2017-03-03 – 2017-03-04 (×2): via INTRAVENOUS

## 2017-03-03 MED ORDER — PHENYLEPHRINE 40 MCG/ML (10ML) SYRINGE FOR IV PUSH (FOR BLOOD PRESSURE SUPPORT)
PREFILLED_SYRINGE | INTRAVENOUS | Status: AC
Start: 2017-03-03 — End: 2017-03-03
  Filled 2017-03-03: qty 10

## 2017-03-03 MED ORDER — WITCH HAZEL-GLYCERIN EX PADS: 1.0000 "application " | MEDICATED_PAD | CUTANEOUS | Status: DC | PRN

## 2017-03-03 MED ORDER — ONDANSETRON HCL 4 MG/2ML IJ SOLN
4.0000 mg | Freq: Four times a day (QID) | INTRAMUSCULAR | Status: DC | PRN
Start: 2017-03-03 — End: 2017-03-03

## 2017-03-03 MED ORDER — DIPHENHYDRAMINE HCL 25 MG PO CAPS: 25.0000 mg | ORAL_CAPSULE | ORAL | Status: DC | PRN

## 2017-03-03 MED ORDER — CHLOROPROCAINE HCL (PF) 3 % IJ SOLN
INTRAMUSCULAR | Status: AC
  Filled 2017-03-03: qty 20

## 2017-03-03 MED ORDER — KETOROLAC TROMETHAMINE 30 MG/ML IJ SOLN: 30.0000 mg | Freq: Four times a day (QID) | INTRAMUSCULAR | Status: AC | PRN

## 2017-03-03 MED ORDER — DEXAMETHASONE SODIUM PHOSPHATE 10 MG/ML IJ SOLN
INTRAMUSCULAR | Status: DC | PRN
  Administered 2017-03-03: 10 mg via INTRAVENOUS

## 2017-03-03 MED ORDER — KETOROLAC TROMETHAMINE 30 MG/ML IJ SOLN
INTRAMUSCULAR | Status: AC
  Filled 2017-03-03: qty 1

## 2017-03-03 MED ORDER — SODIUM CHLORIDE 0.9% FLUSH: 3.0000 mL | INTRAVENOUS | Status: DC | PRN

## 2017-03-03 MED ORDER — ENOXAPARIN SODIUM 40 MG/0.4ML ~~LOC~~ SOLN
40.0000 mg | SUBCUTANEOUS | Status: DC
  Administered 2017-03-04 – 2017-03-05 (×2): 40 mg via SUBCUTANEOUS
  Filled 2017-03-03 (×3): qty 0.4

## 2017-03-03 MED ORDER — NALOXONE HCL 4 MG/10ML IJ SOLN: 1.0000 ug/kg/h | INTRAVENOUS | Status: DC | PRN

## 2017-03-03 MED ORDER — LACTATED RINGERS IV SOLN
INTRAVENOUS | Status: DC | PRN
  Administered 2017-03-03: 10:00:00 via INTRAVENOUS

## 2017-03-03 MED ORDER — SIMETHICONE 80 MG PO CHEW
80.0000 mg | CHEWABLE_TABLET | ORAL | Status: DC
  Administered 2017-03-04 (×2): 80 mg via ORAL
  Filled 2017-03-03 (×2): qty 1

## 2017-03-03 SURGICAL SUPPLY — 34 items
BENZOIN TINCTURE PRP APPL 2/3 (GAUZE/BANDAGES/DRESSINGS) ×3 IMPLANT
CHLORAPREP W/TINT 26ML (MISCELLANEOUS) ×3 IMPLANT
CLAMP CORD UMBIL (MISCELLANEOUS) IMPLANT
CLOSURE WOUND 1/2 X4 (GAUZE/BANDAGES/DRESSINGS) ×1
CLOTH BEACON ORANGE TIMEOUT ST (SAFETY) ×3 IMPLANT
DRSG OPSITE POSTOP 4X10 (GAUZE/BANDAGES/DRESSINGS) ×3 IMPLANT
ELECT REM PT RETURN 9FT ADLT (ELECTROSURGICAL) ×3
ELECTRODE REM PT RTRN 9FT ADLT (ELECTROSURGICAL) ×1 IMPLANT
EXTRACTOR VACUUM KIWI (MISCELLANEOUS) IMPLANT
GLOVE BIO SURGEON ST LM GN SZ9 (GLOVE) ×3 IMPLANT
GLOVE BIOGEL PI IND STRL 7.0 (GLOVE) ×1 IMPLANT
GLOVE BIOGEL PI IND STRL 9 (GLOVE) ×1 IMPLANT
GLOVE BIOGEL PI INDICATOR 7.0 (GLOVE) ×2
GLOVE BIOGEL PI INDICATOR 9 (GLOVE) ×2
GOWN STRL REUS W/TWL 2XL LVL3 (GOWN DISPOSABLE) ×3 IMPLANT
GOWN STRL REUS W/TWL LRG LVL3 (GOWN DISPOSABLE) ×3 IMPLANT
NEEDLE HYPO 25X5/8 SAFETYGLIDE (NEEDLE) IMPLANT
NS IRRIG 1000ML POUR BTL (IV SOLUTION) ×3 IMPLANT
PACK C SECTION WH (CUSTOM PROCEDURE TRAY) ×3 IMPLANT
PAD OB MATERNITY 4.3X12.25 (PERSONAL CARE ITEMS) ×3 IMPLANT
PENCIL SMOKE EVAC W/HOLSTER (ELECTROSURGICAL) ×6 IMPLANT
RTRCTR C-SECT PINK 25CM LRG (MISCELLANEOUS) ×3 IMPLANT
RTRCTR C-SECT PINK 34CM XLRG (MISCELLANEOUS) IMPLANT
STRIP CLOSURE SKIN 1/2X4 (GAUZE/BANDAGES/DRESSINGS) ×2 IMPLANT
SUT MNCRL 0 VIOLET CTX 36 (SUTURE) ×2 IMPLANT
SUT MONOCRYL 0 CTX 36 (SUTURE) ×4
SUT VIC AB 0 CT1 27 (SUTURE) ×2
SUT VIC AB 0 CT1 27XBRD ANBCTR (SUTURE) ×1 IMPLANT
SUT VIC AB 2-0 CT1 27 (SUTURE) ×2
SUT VIC AB 2-0 CT1 TAPERPNT 27 (SUTURE) ×1 IMPLANT
SUT VIC AB 4-0 KS 27 (SUTURE) ×3 IMPLANT
SYR BULB IRRIGATION 50ML (SYRINGE) IMPLANT
TOWEL OR 17X24 6PK STRL BLUE (TOWEL DISPOSABLE) ×3 IMPLANT
TRAY FOLEY BAG SILVER LF 14FR (SET/KITS/TRAYS/PACK) ×3 IMPLANT

## 2017-03-03 NOTE — H&P (Signed)
Obstetric Preoperative History and Physical  Kristen Fowler is a 27 y.o. G1P0 with IUP at [redacted]w[redacted]d by LMP, and pregnancy complicated by A2GDM, and LGA (EFW >4500 by [redacted]w[redacted]d [place]), who is being admitted for IOL for the above. Pt seen in the office 4 days ago and offered primary C/S but declined. However, now requesting primary C/S. Denies any other acute concerns. Reports CBGs have been at goal on metformin. Denies LOF, vaginal bleeding, or regular contractions. Reports good fetal movement.  Prenatal Course Source of Care: [facility]  with onset of care at 24 weeks Pregnancy complications or risks: Patient Active Problem List   Diagnosis Date Noted  . Language barrier 02/19/2017  . Large for gestational age fetus affecting management of mother 02/01/2017  . Gestational diabetes 12/22/2016  . Rh negative, antepartum, second trimester 11/26/2016  . Late prenatal care affecting pregnancy in second trimester 11/26/2016  . Supervision of high risk pregnancy, antepartum 11/22/2016  . Rubella non-immune status, antepartum 11/01/2016   She plans to breastfeed She is unsure about PP contraception, but she is interested in Child psychotherapist.  Prenatal labs and studies: ABO, Rh: --/--/AB NEG (02/24 0045) Antibody: NEG (02/24 0045) Rubella: <0.90 (10/22 0951) RPR: Non Reactive (12/13 0821)  HBsAg: Negative (10/22 0951)  HIV: Non Reactive (12/13 0821)  ZOX:WRUEAVWU (01/30 1532) 2-hr GTT: 110, 229, 129 (postiive) Genetic screening N/A (d/t late [facility]) Anatomy US normal, female U/S at [redacted]w[redacted]d - EFW 4512g/9lb15oz, AC >97%ile  Prenatal Transfer Tool  Maternal Diabetes: Yes:  Diabetes Type:  Insulin/Medication controlled Genetic Screening: not done Maternal Ultrasounds/Referrals: Normal Fetal Ultrasounds or other Referrals:  None Maternal Substance Abuse:  No Significant Maternal Medications:  Meds include: Other: metformin Significant Maternal Lab Results: Lab values include: Rh negative  Past Medical History:   Diagnosis Date  . Gestational diabetes   . Medical history non-contributory     Past Surgical History:  Procedure Laterality Date  . NO PAST SURGERIES      OB History  Gravida Para Term Preterm AB Living  1            SAB TAB Ectopic Multiple Live Births               # Outcome Date GA Lbr Len/2nd Weight Sex Delivery Anes PTL Lv  1 Current               Social History   Socioeconomic History  . Marital status: Married    Spouse name: None  . Number of children: None  . Years of education: None  . Highest education level: None  Social Needs  . Financial resource strain: None  . Food insecurity - worry: None  . Food insecurity - inability: None  . Transportation needs - medical: None  . Transportation needs - non-medical: None  Occupational History  . None  Tobacco Use  . Smoking status: Never Smoker  . Smokeless tobacco: Never Used  Substance and Sexual Activity  . Alcohol use: No    Frequency: Never  . Drug use: No  . Sexual activity: Yes  Other Topics Concern  . None  Social History Narrative  . None    History reviewed. No pertinent family history.  Medications Prior to Admission  Medication Sig Dispense Refill Last Dose  . metFORMIN (GLUCOPHAGE) 500 MG tablet Take 1 tablet (500 mg total) by mouth 2 (two) times daily with a meal. 60 tablet 1 Taking  . Prenatal Multivit-Min-Fe-FA (PRENATAL VITAMINS PO) Take 1 tablet by  mouth daily.   Not Taking  . Prenatal Vit-Fe Fumarate-FA (PRENATAL VITAMIN PLUS LOW IRON) 27-1 MG TABS Take 1 tablet by mouth daily. 30 tablet 5 Taking    No Known Allergies  Review of Systems: Negative except for what is mentioned in HPI.  Physical Exam: BP (!) 135/95   Pulse 93   Temp 98.8 F (37.1 C) (Oral)   Resp 18   Ht 5\' 4"  (1.626 m)   Wt 209 lb (94.8 kg)   LMP 06/03/2016   BMI 35.87 kg/m   CONSTITUTIONAL: Well-developed, well-nourished female in no acute distress.  HENT:  Normocephalic, atraumatic. Oropharynx is  clear and moist EYES: Conjunctivae and EOM are normal. No scleral icterus.  NECK: Normal range of motion, supple SKIN: Skin is warm and dry. No rash noted. Not diaphoretic. No erythema. NEUROLGIC: Alert and oriented to person, place, and time. Normal reflexes, muscle tone coordination. No cranial nerve deficit noted. PSYCHIATRIC: Normal mood and affect. Normal behavior. CARDIOVASCULAR: Normal heart rate noted, regular rhythm RESPIRATORY: Effort and breath sounds normal, no problems with respiration noted ABDOMEN: Soft, nontender, gravid.  MUSCULOSKELETAL: Normal range of motion. No edema and no tenderness. 2+ distal pulses.  SVE: 1/thick/-3 FHT: baseline rate 145, moderate variability, +acel, no decel  Pertinent Labs/Studies:   Results for orders placed or performed during the hospital encounter of 03/03/17 (from the past 72 hour(s))  CBC     Status: None   Collection Time: 03/03/17 12:45 AM  Result Value Ref Range   WBC 9.1 4.0 - 10.5 K/uL   RBC 4.25 3.87 - 5.11 MIL/uL   Hemoglobin 12.1 12.0 - 15.0 g/dL   HCT 16.1 09.6 - 04.5 %   MCV 86.6 78.0 - 100.0 fL   MCH 28.5 26.0 - 34.0 pg   MCHC 32.9 30.0 - 36.0 g/dL   RDW 40.9 81.1 - 91.4 %   Platelets 154 150 - 400 K/uL    Comment: Performed at [facility], [address] [place], [place] [address]  Type and screen     Status: None   Collection Time: 03/03/17 12:45 AM  Result Value Ref Range   ABO/RH(D) AB NEG    Antibody Screen NEG    Sample Expiration      03/06/2017 Performed at [facility], [address] [place], [place] [address]   Glucose, capillary     Status: None   Collection Time: 03/03/17  2:11 AM  Result Value Ref Range   Glucose-Capillary 79 65 - 99 mg/dL    Assessment and Plan :Kristen Fowler is a 27 y.o. G1P0 at [redacted]w[redacted]d being admitted for IOL for A2GDM and suspected fetal macrosomia (U/S at [redacted]w[redacted]d - EFW 4512g/9lb15oz, AC >97%ile). IOL vs primary C/S dicussed with patient and risks and benefits  were discussed, and patient elected for primary C/S.  The risks of cesarean section discussed with the patient included but were not limited to: bleeding which may require transfusion or reoperation; infection which may require antibiotics; injury to bowel, bladder, ureters or other surrounding organs; injury to the fetus; need for additional procedures including hysterectomy in the event of a life-threatening hemorrhage; placental abnormalities wth subsequent pregnancies, incisional problems, thromboembolic phenomenon and other postoperative/anesthesia complications. The patient concurred with the proposed plan, giving informed written consent for the procedure. Patient has been NPO since 1700 last night she will remain NPO for procedure. Preoperative prophylactic antibiotics and SCDs ordered. C/S posted for 0900.  Patient seen and all of the above discussed using  Spanish interpreter via [employer].  Raynelle FanningJulie P. Willis Holquin, MD OB Fellow Faculty Practice, [facility]

## 2017-03-03 NOTE — Anesthesia Postprocedure Evaluation (Signed)
Anesthesia Post Note  Patient: Kristen Fowler  Procedure(s) Performed: CESAREAN SECTION (N/A Abdomen)     Patient location during evaluation: [facility] Unit Anesthesia Type: Spinal Level of consciousness: oriented and awake and alert Pain management: pain level controlled Vital Signs Assessment: post-procedure vital signs reviewed and stable Respiratory status: spontaneous breathing, respiratory function stable and patient connected to nasal cannula oxygen Cardiovascular status: blood pressure returned to baseline and stable Postop Assessment: no headache, no backache and no apparent nausea or vomiting Anesthetic complications: no    Last Vitals:  Vitals:   03/03/17 1300 03/03/17 1400  BP: (!) 114/53 118/60  Pulse: 72 65  Resp: 18 18  Temp: 36.7 C 36.7 C  SpO2: 99% 98%    Last Pain:  Vitals:   03/03/17 1400  TempSrc: Oral  PainSc: 0-No pain   Pain Goal:                 Trevor IhaStephen A Charee Tumblin

## 2017-03-03 NOTE — Anesthesia Preprocedure Evaluation (Addendum)
Anesthesia Evaluation  Patient identified by MRN, date of birth, ID band Patient awake    Airway Mallampati: II  TM Distance: >3 FB Neck ROM: Full    Dental no notable dental hx.    Pulmonary neg pulmonary ROS,    Pulmonary exam normal breath sounds clear to auscultation       Cardiovascular negative cardio ROS Normal cardiovascular exam Rhythm:Regular Rate:Normal     Neuro/Psych    GI/Hepatic   Endo/Other  diabetes, Gestational  Renal/GU      Musculoskeletal   Abdominal   Peds  Hematology   Anesthesia Other Findings   Reproductive/Obstetrics (+) Pregnancy                             Lab Results  Component Value Date   WBC 9.1 03/03/2017   HGB 12.1 03/03/2017   HCT 36.8 03/03/2017   MCV 86.6 03/03/2017   PLT 154 03/03/2017    Anesthesia Physical Anesthesia Plan  ASA: III  Anesthesia Plan: Spinal   Post-op Pain Management:  Regional for Post-op pain   Induction:   PONV Risk Score and Plan: Treatment may vary due to age or medical condition  Airway Management Planned: Mask, Natural Airway and Nasal Cannula  Additional Equipment:   Intra-op Plan:   Post-operative Plan:   Informed Consent: I have reviewed the patients History and Physical, chart, labs and discussed the procedure including the risks, benefits and alternatives for the proposed anesthesia with the patient or authorized representative who has indicated his/her understanding and acceptance.     Plan Discussed with: CRNA and Anesthesiologist  Anesthesia Plan Comments:         Anesthesia Quick Evaluation

## 2017-03-03 NOTE — Transfer of Care (Signed)
Immediate Anesthesia Transfer of Care Note  Patient: Kristen BrasilKaren Bermudez Sanchez  Procedure(s) Performed: CESAREAN SECTION (N/A Abdomen)  Patient Location: PACU  Anesthesia Type:Spinal  Level of Consciousness: awake, alert  and oriented  Airway & Oxygen Therapy: Patient Spontanous Breathing  Post-op Assessment: Report given to RN and Post -op Vital signs reviewed and stable  Post vital signs: Reviewed and stable  Last Vitals:  Vitals:   03/03/17 0753 03/03/17 0754  BP:  128/64  Pulse:  83  Resp: 16   Temp: 37.3 C     Last Pain:  Vitals:   03/03/17 0753  TempSrc: Oral  PainSc:          Complications: No apparent anesthesia complications

## 2017-03-03 NOTE — Op Note (Addendum)
Kristen Fowler PROCEDURE DATE: 03/03/2017  PREOPERATIVE DIAGNOSES: Intrauterine pregnancy at 243w0d weeks gestation; macrosomia  POSTOPERATIVE DIAGNOSES: The same  PROCEDURE: Primary Low Transverse Cesarean Section  SURGEON, primary: [name], MD SURGEON, fellow: Rolm BookbinderAmber Moss, DO  ANESTHESIOLOGY TEAM: Anesthesiologist: Trevor IhaHouser, Stephen A, MD CRNA: Elgie CongoMalinova, Nataliya H, CRNA  INDICATIONS: Kristen Fowler is a 27 y.o. G1P0 at 2243w0d here for cesarean section secondary to the indications listed under preoperative diagnoses; please see preoperative note for further details.  The risks of cesarean section were discussed with the patient including but were not limited to: bleeding which may require transfusion or reoperation; infection which may require antibiotics; injury to bowel, bladder, ureters or other surrounding organs; injury to the fetus; need for additional procedures including hysterectomy in the event of a life-threatening hemorrhage; placental abnormalities wth subsequent pregnancies, incisional problems, thromboembolic phenomenon and other postoperative/anesthesia complications.   The patient concurred with the proposed plan, giving informed written consent for the procedure.    FINDINGS:  Viable female infant in cephalic presentation.  Apgars 8 and 9.  Meconium amniotic fluid.  Intact placenta, three vessel cord.  Normal uterus, fallopian tubes and ovaries bilaterally.  ANESTHESIA: Spinal  ESTIMATED BLOOD LOSS: 950 ml URINE OUTPUT:  100 ml SPECIMENS: Placenta sent to L&D COMPLICATIONS: None immediate  PROCEDURE IN DETAIL:  The patient preoperatively received intravenous antibiotics and had sequential compression devices applied to her lower extremities.  She was then taken to the operating room where spinal anesthesia was administered and was found to be adequate. She was then placed in a dorsal supine position with a leftward tilt, and prepped and draped in a sterile  manner.  A foley catheter was placed into her bladder and attached to constant gravity.  After an adequate timeout was performed, an elliptical skin incision was made with scalpel and excess subcutaneous fat was removed with cauterization. Scalpel was used to carry through to the underlying layer of fascia. The fascia was bluntly opened bilaterally  Kocher clamps were applied to the superior aspect of the fascial incision and the underlying rectus muscles were dissected off bluntly.  A similar process was carried out on the inferior aspect of the fascial incision. The rectus muscles were separated in the midline bluntly and the peritoneum was entered bluntly. Attention was turned to the lower uterine segment. A bladder flap was made using Metzenbaum scissors. Then, a low transverse hysterotomy was made with a scalpel and extended bilaterally bluntly.  The infant was successfully delivered, the cord was clamped and cut after one minute, and the infant was handed over to the awaiting neonatology team. Uterine massage was then administered, and the placenta delivered intact with a three-vessel cord. The uterus was then cleared of clots and debris.  The hysterotomy was closed with 0 Moncoryl in a running locked fashion, and an imbricating layer was also placed with 0 Moncoryl. The pelvis was cleared of all clot and debris. Hemostasis was confirmed on all surfaces.  The peritoneum was closed with a 2-0 Vicryl running stitch. The fascia was then closed using 0 Vicryl in a running fashion.  The subcutaneous layer was irrigated, then reapproximated with 2-0 plain gut interrupted stitches.The skin was closed with a 4-0 Vicryl subcuticular stitch. The patient tolerated the procedure well. Sponge, lap, instrument and needle counts were correct x 3.  She was taken to the recovery room in stable condition.    Rolm BookbinderAmber Moss, DO Faculty Practice, [facility]

## 2017-03-03 NOTE — Anesthesia Postprocedure Evaluation (Signed)
Anesthesia Post Note  Patient: Loretha BrasilKaren Bermudez Sanchez  Procedure(s) Performed: CESAREAN SECTION (N/A Abdomen)     Patient location during evaluation: Mother Baby Anesthesia Type: Spinal Level of consciousness: awake Pain management: pain level controlled Vital Signs Assessment: post-procedure vital signs reviewed and stable Respiratory status: spontaneous breathing Cardiovascular status: stable Postop Assessment: no headache, no backache, spinal receding, patient able to bend at knees, no apparent nausea or vomiting and adequate PO intake Anesthetic complications: no    Last Vitals:  Vitals:   03/03/17 1200 03/03/17 1300  BP: 121/60 (!) 114/53  Pulse: 60 72  Resp: 18 18  Temp: 36.9 C 36.7 C  SpO2: 100% 99%    Last Pain:  Vitals:   03/03/17 1300  TempSrc: Oral  PainSc: 0-No pain   Pain Goal:                 Oriana Horiuchi

## 2017-03-03 NOTE — Anesthesia Procedure Notes (Deleted)
Epidural Patient location during procedure: OB Start time: 03/03/2017 7:34 AM End time: 03/03/2017 7:58 AM  Staffing Anesthesiologist: Jerusha Reising A, MD Performed: anesthesiologist   Preanesthetic Checklist Completed: patient identified, site marked, surgical consent, pre-op evaluation, timeout performed, IV checked, risks and benefits discussed and monitors and equipment checked  Epidural Patient position: sitting Prep: site prepped and draped and DuraPrep Patient monitoring: continuous pulse ox and blood pressure Approach: midline Location: L3-L4 Injection technique: LOR air  Needle:  Needle type: Tuohy  Needle gauge: 17 G Needle length: 9 cm and 9 Needle insertion depth: 5 cm cm Catheter type: closed end flexible Catheter size: 19 Gauge Catheter at skin depth: 10 cm Test dose: negative  Assessment Events: blood not aspirated, injection not painful, no injection resistance, negative IV test and no paresthesia     

## 2017-03-03 NOTE — Anesthesia Procedure Notes (Signed)
Spinal  Patient location during procedure: OB Start time: 03/03/2017 9:00 AM End time: 03/03/2017 9:04 AM Staffing Anesthesiologist: Trevor IhaHouser, Stephen A, MD Performed: anesthesiologist  Preanesthetic Checklist Completed: patient identified, surgical consent, pre-op evaluation, timeout performed, IV checked, risks and benefits discussed and monitors and equipment checked Spinal Block Patient position: sitting Prep: site prepped and draped and DuraPrep Patient monitoring: heart rate, cardiac monitor, continuous pulse ox and blood pressure Approach: midline Location: L3-4 Injection technique: single-shot Needle Needle type: Pencan  Needle gauge: 24 G Needle length: 10 cm Assessment Sensory level: T4

## 2017-03-03 NOTE — Lactation Note (Signed)
This note was copied from a baby's chart. Lactation Consultation Note  Patient Name: Boy Loretha BrasilKaren Bermudez Sanchez Today's Date: 03/03/2017 Reason for consult: Initial assessment;Primapara;1st time breastfeeding;Term  14 hours old FT LGA baby who is being exclusively BF by his mother. Baby was already nursing at the breast when entering the room, mom had baby in several layers of clothing plus two blankets. Explained to parents the importance of STS during feedings, when baby stopped nursing, LC undressed him down to his diaper, burped him (baby was very gassy and had a small emesis- colostrum noted- approximately 3 cc.   Latched baby back on STS and he was able to latch but didn't sustain the latch for more than 5 minutes. Taught mom how to hand express and she was able to get 2-3  drops of colostrum, fed those back to baby when he relatched at the breast. Nipples look intact, no signs of trauma, mom also states that feedings at the breast are comfortable so far.  Encouraged mom to feed baby 8-12 times/day on feeding cues and do STS during feedings. Parents will burp baby before and after feedings in order to avoid emesis. Reviewed BF brochure (SP), BF resources and feeding diary, mom is aware of LC services and will call PRN.  Maternal Data Formula Feeding for Exclusion: No Has patient been taught Hand Expression?: Yes Does the patient have breastfeeding experience prior to this delivery?: No  Feeding Feeding Type: Breast Fed Length of feed: 5 min  LATCH Score Latch: Repeated attempts needed to sustain latch, nipple held in mouth throughout feeding, stimulation needed to elicit sucking reflex.  Audible Swallowing: A few with stimulation  Type of Nipple: Everted at rest and after stimulation  Comfort (Breast/Nipple): Soft / non-tender  Hold (Positioning): Assistance needed to correctly position infant at breast and maintain latch.  LATCH Score: 7  Interventions Interventions: Breast  feeding basics reviewed;Assisted with latch;Skin to skin;Breast massage;Breast compression;Adjust position;Hand express;Support pillows;Position options;Expressed milk  Lactation Tools Discussed/Used WIC Program: Yes   Consult Status Consult Status: Follow-up Date: 03/04/17 Follow-up type: In-patient    Lillyian Heidt Venetia ConstableS Kaydn Kumpf 03/03/2017, 11:43 PM

## 2017-03-04 ENCOUNTER — Encounter (HOSPITAL_COMMUNITY): Payer: Self-pay | Admitting: Obstetrics and Gynecology

## 2017-03-04 LAB — GLUCOSE, CAPILLARY
GLUCOSE-CAPILLARY: 83 mg/dL (ref 65–99)
Glucose-Capillary: 74 mg/dL (ref 65–99)

## 2017-03-04 LAB — CBC
HEMATOCRIT: 28.6 % — AB (ref 36.0–46.0)
Hemoglobin: 9.6 g/dL — ABNORMAL LOW (ref 12.0–15.0)
MCH: 29.2 pg (ref 26.0–34.0)
MCHC: 33.6 g/dL (ref 30.0–36.0)
MCV: 86.9 fL (ref 78.0–100.0)
Platelets: 126 10*3/uL — ABNORMAL LOW (ref 150–400)
RBC: 3.29 MIL/uL — ABNORMAL LOW (ref 3.87–5.11)
RDW: 14.4 % (ref 11.5–15.5)
WBC: 9.9 10*3/uL (ref 4.0–10.5)

## 2017-03-04 MED ORDER — RHO D IMMUNE GLOBULIN 1500 UNIT/2ML IJ SOSY
300.0000 ug | PREFILLED_SYRINGE | Freq: Once | INTRAMUSCULAR | Status: AC
  Administered 2017-03-04: 300 ug via INTRAVENOUS
  Filled 2017-03-04: qty 2

## 2017-03-04 MED ORDER — FERROUS SULFATE 325 (65 FE) MG PO TABS
325.0000 mg | ORAL_TABLET | Freq: Every day | ORAL | Status: DC
  Administered 2017-03-04 – 2017-03-05 (×2): 325 mg via ORAL
  Filled 2017-03-04 (×2): qty 1

## 2017-03-04 MED ORDER — MEASLES, MUMPS & RUBELLA VAC ~~LOC~~ INJ
0.5000 mL | INJECTION | Freq: Once | SUBCUTANEOUS | Status: AC
  Administered 2017-03-05: 0.5 mL via SUBCUTANEOUS
  Filled 2017-03-04: qty 0.5

## 2017-03-04 NOTE — Progress Notes (Signed)
In house interpreter used to discuss plan of care. Discussed option to shower and to remove pressure dressing at that time, discussed her pain, breast feeding, supplement volumes, and edinburgh. Discussed plan for baby, labs at 10, vitals after 11 and feedings.

## 2017-03-04 NOTE — Progress Notes (Signed)
POSTPARTUM PROGRESS NOTE  Post Partum Day 1  Subjective:  Kristen Fowler is a 27 y.o. G1P0 s/p pLTCS and BTL for LGA at 7658w1d.  No acute events overnight.  Pt denies problems with ambulating, voiding or po intake. She reports only getting up one or twice since the cesarean section.  She denies nausea or vomiting.  Pain is well controlled.  She has not had flatus. She has not had bowel movement.  Lochia Small.   Objective: Blood pressure (!) 109/55, pulse 74, temperature 98.2 F (36.8 C), temperature source Oral, resp. rate 20, height 5\' 4"  (1.626 m), weight 209 lb (94.8 kg), last menstrual period 06/03/2016, SpO2 98 %.  Physical Exam:  General: alert, cooperative and no distress Chest: no respiratory distress Heart:regular rate, distal pulses intact Abdomen: soft, nontender,  Uterine Fundus: firm, appropriately tender DVT Evaluation: No calf swelling or tenderness Extremities: No edema noted Skin: clean/dry/intact pressure dressing in place over honeycomb   Recent Labs    03/03/17 0045 03/04/17 0518  HGB 12.1 9.6*  HCT 36.8 28.6*    Assessment/Plan: Kristen Fowler is a 27 y.o. G1P0 s/p pLTCS and BTL at 6058w1d   PPD#1 - Doing well, discussed getting up and moving today to start the process of moving bowels and healing. Pt agrees to POC using interpreter.  Contraception: BTL done during C/S Feeding: Breast Dispo: Plan for discharge tomorrow.   LOS: 1 day   Sharyon CableRogers, Ronald Londo C, CNM 03/04/2017, 8:06 AM

## 2017-03-05 LAB — RH IG WORKUP (INCLUDES ABO/RH)
ABO/RH(D): AB NEG
FETAL SCREEN: NEGATIVE
GESTATIONAL AGE(WKS): 39
Unit division: 0

## 2017-03-05 MED ORDER — IBUPROFEN 600 MG PO TABS: 600.0000 mg | ORAL_TABLET | Freq: Four times a day (QID) | ORAL | 0 refills | Status: AC

## 2017-03-05 NOTE — Discharge Summary (Addendum)
OB Discharge Summary   Patient Name: Kristen Fowler DOB: Feb 10, 1990 MRN: [id number] Date of admission: 03/03/2017  Delivering MD: Tilda BurrowFERGUSON, JOHN V )  Date of discharge: 03/05/2017  Admitting diagnosis: IOL for A2GDM, LGA (EFW >4500), elected for pLTCS at admission. Intrauterine pregnancy: 8416w2d    Secondary diagnosis:  Active Problems:   Patient Active Problem List   Diagnosis Date Noted  . S/P C-section 03/03/2017  . Language barrier 02/19/2017  . Large for gestational age fetus affecting management of mother 02/01/2017  . Gestational diabetes 12/22/2016  . Rh negative, antepartum, second trimester 11/26/2016  . Late prenatal care affecting pregnancy in second trimester 11/26/2016  . Supervision of high risk pregnancy, antepartum 11/22/2016  . Rubella non-immune status, antepartum 11/01/2016   Additional problems:  A2GDM on Metformin with good control Late prenatal care at 24 weeks     Discharge diagnosis: Term Pregnancy Delivered and GDM A2                                                                                                Post partum procedures:rhogam  Complications: None  Hospital course:  Induction of Labor With Cesarean Section  [name] at 316w2d was admitted to the hospital 03/03/2017 for induction of labor as previously discussed outpatient but elected for C section on admission due to LGA. She delivered a Viable infant on 03/03/2017 with Membrane Rupture at delivery. Details of operation can be found in separate operative Note.  Patient had an uncomplicated postpartum course with CBGs well controlled off Metformin. She is ambulating, tolerating a regular diet, passing flatus, and urinating well.  Patient is discharged home in stable condition on 03/05/17.                                   Physical exam  Vitals:   03/04/17 1851 03/05/17 0519  BP: (!) 109/57 121/70  Pulse: 90 67  Resp: 20 18  Temp: 97.7 F (36.5 C) 97.8 F (36.6 C)  SpO2:  100% 100%   General: alert, cooperative and no distress CV: regular rate and rhythm Lochia: appropriate Uterine Fundus: firm Incision: Dressing is clean, dry, and intact DVT Evaluation: No evidence of DVT seen on physical exam.  Labs: No results found for this or any previous visit (from the past 24 hour(s)).  Discharge instruction: per After Visit Summary and "Baby and Me Booklet".  After visit meds:  No Known Allergies  Allergies as of 03/05/2017   No Known Allergies     Medication List    STOP taking these medications   metFORMIN 500 MG tablet Commonly known as:  GLUCOPHAGE     TAKE these medications   ibuprofen 600 MG tablet Commonly known as:  ADVIL,MOTRIN Take 1 tablet (600 mg total) by mouth every 6 (six) hours.   PRENATAL VITAMIN PLUS LOW IRON 27-1 MG Tabs Take 1 tablet by mouth daily.       Diet: routine diet  Activity: Advance as tolerated. Pelvic rest for 6 weeks.  Outpatient follow up: skin incision check in 1 week, PP f/u in 4 weeks Future Appointments: No future appointments.  Follow up Appt: No Follow-up on file.  Postpartum contraception: LARC  Newborn Data: APGAR (1 MIN): 8   APGAR (5 MINS): 9    Baby Feeding: Breast Disposition:home with mother  Ellwood Dense, DO  03/05/2017  OB FELLOW DISCHARGE ATTESTATION  I have seen and examined this patient and agree with above documentation in the resident's note.   Frederik Pear, MD OB Fellow

## 2017-03-05 NOTE — Progress Notes (Signed)
LTCS dressing is changed with aseptic technique. No abn S & S at site. Pt tolerated procedure well.

## 2017-03-05 NOTE — Discharge Instructions (Signed)
Parto por Kristen Fowler, cuidados posteriores Cesarean Delivery, Care After Siga estas instrucciones durante las prximas semanas. Estas indicaciones le proporcionan informacin acerca de cmo deber cuidarse despus del procedimiento. Su mdico tambin podr darle indicaciones ms especficas. El tratamiento ha sido planificado segn las prcticas mdicas actuales, pero en algunos casos pueden ocurrir problemas. Comunquese con el mdico si tiene algn problema o preguntas despus del procedimiento. Qu puedo esperar despus del procedimiento? Despus del procedimiento, es comn DIRECTVtener los siguientes sntomas:  Una pequea cantidad de sangre o un lquido transparente que sale de la incisin.  Tiene enrojecimiento, hinchazn o dolor en la zona de la incisin.  Dolores y [employer] molestias abdominales.  Hemorragia vaginal (loquios).  Calambres plvicos.  Fatiga.  Siga estas indicaciones en su casa: Cuidados de la incisin   Siga las indicaciones del mdico acerca del cuidado de la incisin. Haga lo siguiente: ? Lvese las manos con agua y jabn antes de cambiar la venda (vendaje). Use desinfectante para manos si no dispone de [place] y [place]n. ? Cambie el vendaje como se lo haya indicado el mdico. ? No retire los puntos (suturas), las grapas cutneas, la goma para cerrar la piel o las tiras Sligoadhesivas. Es posible que estos cierres cutneos Conservation officer, naturedeban permanecer en la piel durante 2semanas o ms tiempo. Si los bordes de las tiras [address]adhesivas empiezan a despegarse y Scientific laboratory technicianenroscarse, puede recortar los que estn sueltos. No retire las tiras Agilent Technologiesadhesivas por completo a menos que el mdico se lo indique.  Controle todos los das la zona de la incisin para detectar signos de infeccin. Est atenta a los siguientes signos: ? Aumento del enrojecimiento, la hinchazn o Chief Technology Officerel dolor. ? Mayor presencia de lquido o Afftonsangre. ? Calor. ? Pus o mal olor.  Cuando tosa o estornude, abrace Rockwell Automationuna almohada. Esto ayuda con el dolor y Apple Computerdisminuye  la posibilidad de que su incisin se abra (dehiscencia). Haga esto hasta que cicatrice completamente. Medicamentos  CenterPoint Energyome los medicamentos de venta libre y los recetados solamente como se lo haya indicado el mdico.  Si le recetaron un antibitico, tmelo como se lo haya indicado el mdico. No interrumpa la administracin del antibitico hasta que lo haya terminado. Conducir  No conduzca ni opere maquinaria pesada mientras toma analgsicos recetados.  No conduzca durante 24horas si le administraron un sedante. Estilo de vida  No beba alcohol. Esto es de suma importancia si est amamantando o toma analgsicos.  No consuma productos que contengan tabaco, incluidos cigarrillos, tabaco de Theatre managermascar o cigarrillos electrnicos. Si necesita ayuda para dejar de fumar, consulte al mdico. El tabaco puede retrasar la cicatrizacin. Qu debe comer y beber  Beba al menos 8vasos de ochoonzas (240cc) de agua todos los [address]  [address] a menos que el mdico le indique lo contrario. Si amamanta, quiz deba beber an ms cantidad de agua.  Coma alimentos ricos en Enbridge Energyfibras todos los das. Estos alimentos pueden ayudarla a prevenir o Educational psychologistaliviar el estreimiento. Los alimentos ricos en fibras incluyen, entre otros: ? Panes y cereales integrales. ? Arroz integral. ? Armed forces operational officerrijoles. ? Nils PyleFrutas y verduras frescas. Actividad  Retome sus actividades normales como se lo haya indicado el mdico. Pregntele al mdico qu actividades son seguras para usted.  Descanse todo lo que pueda. Trate de descansar o tomar una siesta mientras el beb duerme.  No levante objetos que pesen ms que su beb o ms de 10 libras (4,5 kg), como se lo haya indicado el mdico.  Pregntele al mdico cundo puede retomar la actividad sexual. Esto puede depender de  lo siguiente: ? Riesgo de sufrir una infeccin. ? Velocidad de cicatrizacin. ? Comodidad y deseo de [employer] sexual. Baarse  No tome baos de inmersin, no nade ni use el jacuzzi  hasta que el mdico lo autorice. Pregntele al mdico si puede ducharse. Delle Reining solo le permitan darse baos de esponja hasta que la incisin se cure.  Mantenga el vendaje seco, como se lo haya indicado el mdico. Instrucciones generales  No use tampones ni se haga duchas vaginales hasta que el mdico la autorice.  Use lo siguiente: ? Ropa cmoda y suelta. ? Un sostn firme y [place].  Controle la sangre que elimina por la vagina para detectar cogulos de [place]. Estos pueden tener el aspecto de grumos de color rojo oscuro, o secrecin marrn o negra.  Mantenga el perineo limpio y seco, como se lo haya indicado el mdico.  Cuando vaya al bao, siempre higiencese de adelante hacia atrs.  Si es posible, pdale a alguien que la ayude a cuidar de su beb y con las tareas del hogar durante [employer] despus de que le den el alta del hospital.  Chauncy Passy a todas las visitas de seguimiento para usted y el beb, como se lo haya indicado el mdico. Esto es importante. Comunquese con un mdico si:  Tiene los siguientes sntomas: ? Una secrecin vaginal con mal olor. ? Dificultad para orinar. ? Dolor al ConocoPhillips. ? Aumento o disminucin repentinos de la frecuencia de las deposiciones. ? Aumento del enrojecimiento, la hinchazn o el dolor alrededor de la incisin. ? Aumento del lquido o de la sangre que sale de la incisin. ? Pus o mal olor en Immunologist de la incisin. ? Fiebre. ? Erupcin cutnea. ? Poco inters o falta de inters en actividades que solan gustarle. ? Dudas sobre su cuidado y el del beb. ? Nuseas.  La incisin est caliente al tacto.  Siente dolor en las mamas y se ponen rojas o duras.  Siente tristeza o preocupacin de forma inusual.  Vomita.  Elimina cogulos de sangre grandes por la vagina. Si expulsa un cogulo de sangre, gurdelo para mostrrselo al [employer]. No tire la cadena sin mostrarle los cogulos de sangre a su mdico.  Orina ms de lo  habitual.  Se siente mareada o se desmaya.  No ha amamantado y no ha tenido un perodo menstrual durante 12 semanas despus del [place].  Dej de amamantar al beb y no ha tenido su perodo menstrual durante 12 semanas despus de haber dejado de Museum/gallery exhibitions officer. Solicite ayuda de inmediato si:  Tiene los siguientes sntomas: ? Dolor que no desaparece o no mejora con medicamentos. ? Journalist, newspaper. ? Dificultad para respirar. ? Visin borrosa o Nurse, adult. ? Pensamientos de autolesionarse o lesionar al beb. ? Air cabin crew abdomen o en una de las piernas. ? Dolor de cabeza intenso.  Se desmaya.  Tiene una hemorragia tan intensa de la vagina que [employer] compresas higinicas en Georgianne Fick. Esta informacin no tiene Theme park manager el consejo del mdico. Asegrese de hacerle al mdico cualquier pregunta que tenga. Document Released: 12/25/2004 Document Revised: 04/16/2016 Document Reviewed: 11/29/2014 Elsevier Interactive Patient Education  [employer].

## 2017-03-05 NOTE — Lactation Note (Signed)
This note was copied from a baby's chart. Lactation Consultation Note  Patient Name: Kristen Loretha BrasilKaren Bermudez Sanchez ZOXWR'UToday's Date: 03/05/2017 Reason for consult: Follow-up assessment;Infant weight loss;1st time breastfeeding;Term   Follow up with mom of 51 hour old infant. Spoke with parents with assistance of Home DepotPacific Interpreter Martha # [phone]. Infant with 3 BF for 10-20 minutes, 1 BF attempt, formula x 4 of 10-20 cc, 5 voids and 1 stool in the last 24 hours. LATCH scores 7-8. Infant was reweighed by Tamela OddiBetsy, RN due to discrepency in weight and weighed 8 lbs 9.9 oz with 7% weight loss since birth.   Mom reports BF is going well. She denies nipple pain with latch. Mom reports her breast feels soft, she is able to hand express colostrum. Offered mom DEBP for home use until her milk comes in, mom declined. Manual pump pump given with instructions for use and cleaning. Enc mom to pump both breasts after BF and save milk for next feeding. Enc mom to offer breast with each feeding and then offer supplement after. Mom to offer formula/EBM post BF as infant needs.   Reviewed I/O, engorgement prevention/treatment, signs of dehydration in the infant, and breast milk expression and storage reviewed.   Parents questioned about having formula for home use, they report they are unable to purchase formula. WIC was notified and will speak with mom prior to d/c home.   Parents report they have no other questions/concerns at this time.      Maternal Data Has patient been taught Hand Expression?: Yes Does the patient have breastfeeding experience prior to this delivery?: No  Feeding Feeding Type: Breast Fed Length of feed: 10 min  LATCH Score                   Interventions    Lactation Tools Discussed/Used WIC Program: Yes   Consult Status Consult Status: Complete Follow-up type: Call as needed    Ed BlalockSharon S Tuere Nwosu 03/05/2017, 1:25 PM

## 2017-03-06 ENCOUNTER — Encounter (HOSPITAL_COMMUNITY): Payer: Self-pay | Admitting: *Deleted

## 2017-03-11 ENCOUNTER — Ambulatory Visit: Payer: Self-pay | Admitting: General Practice

## 2017-03-11 VITALS — BP 132/87 | HR 68 | Ht 62.0 in | Wt 196.0 lb

## 2017-03-11 DIAGNOSIS — Z5189 Encounter for other specified aftercare: Secondary | ICD-10-CM

## 2017-03-11 NOTE — Progress Notes (Signed)
Patient here for wound check today following c-section 2/24. Honeycomb dressing still present. Dressing removed. Incision clean, dry & intact. Wound care and S&S of infection reviewed with patient. Patient verbalized understanding to all & had no questions

## 2017-03-11 NOTE — Progress Notes (Signed)
Chart reviewed - agree with RN documentation.   

## 2017-04-01 ENCOUNTER — Ambulatory Visit: Payer: Self-pay | Admitting: Nurse Practitioner

## 2017-04-04 ENCOUNTER — Ambulatory Visit: Payer: Medicaid Other | Admitting: Obstetrics & Gynecology

## 2017-04-12 ENCOUNTER — Other Ambulatory Visit: Payer: Self-pay | Admitting: *Deleted

## 2017-04-12 DIAGNOSIS — O24439 Gestational diabetes mellitus in the puerperium, unspecified control: Secondary | ICD-10-CM

## 2017-04-15 ENCOUNTER — Other Ambulatory Visit: Payer: Self-pay

## 2019-11-08 IMAGING — US US MFM OB FOLLOW-UP
1 series · 14 of 28 positions shown · non-contrast
Comparison: none

[Series 1: us mfm ob follow-up · 41 acquisitions, 14 frames shown]
[im 2/41]
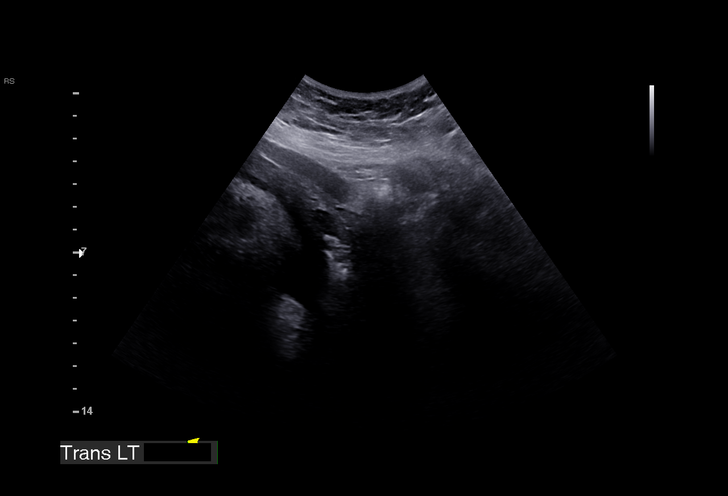
[im 5/41]
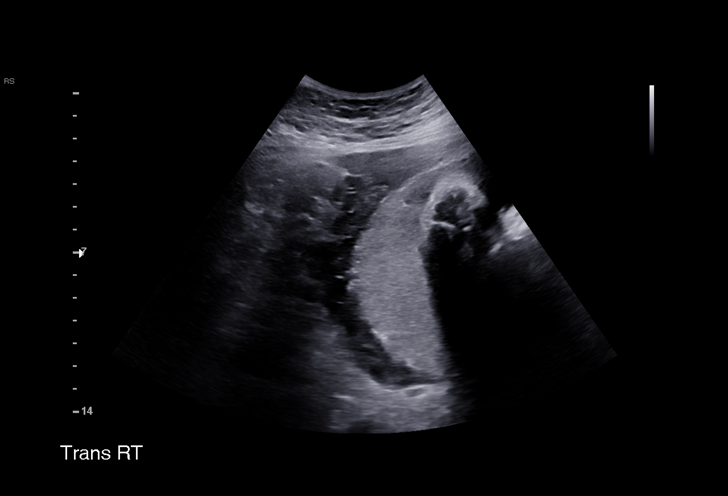
[im 8/41]
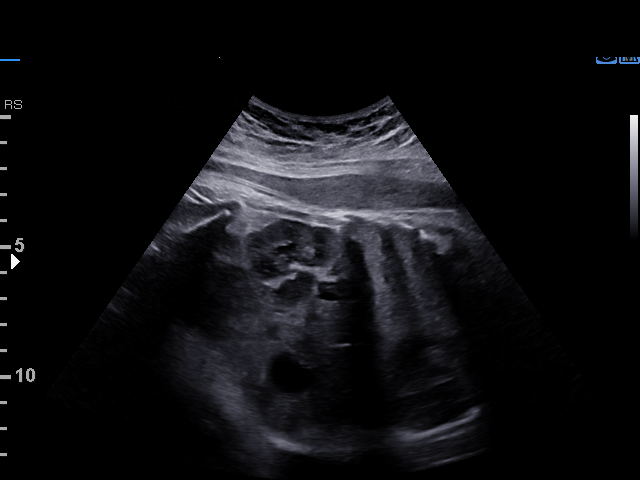
[im 11/41]
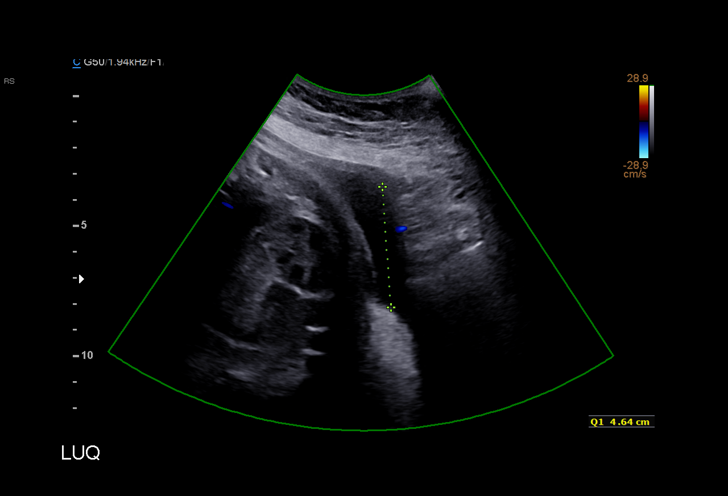
[im 14/41]
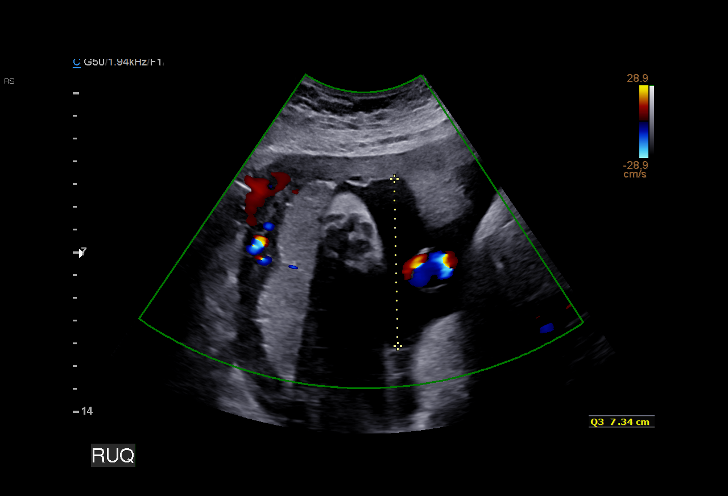
[im 17/41]
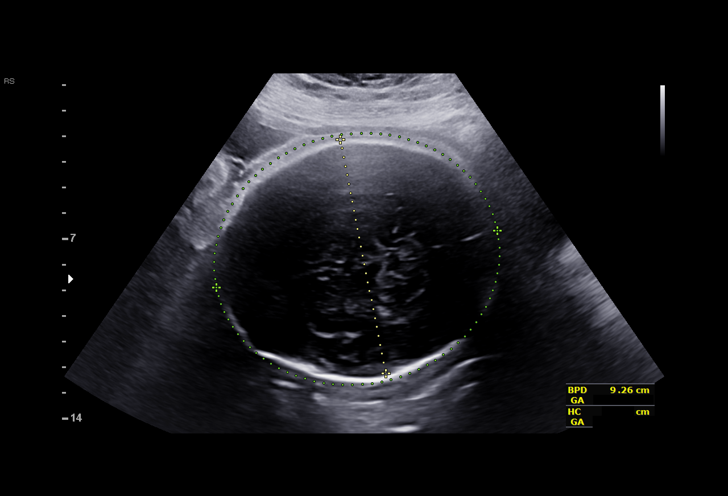
[im 20/41]
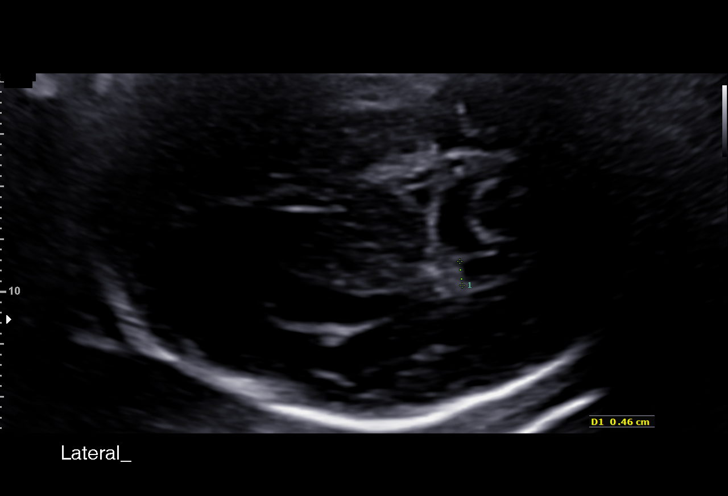
[im 23/41]
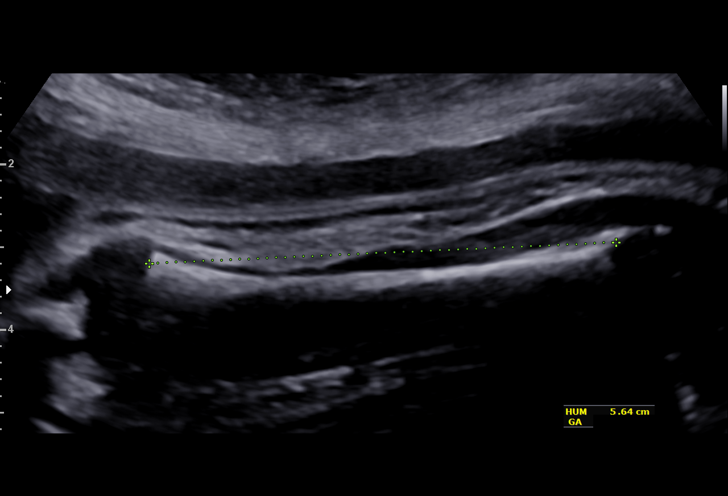
[im 26/41]
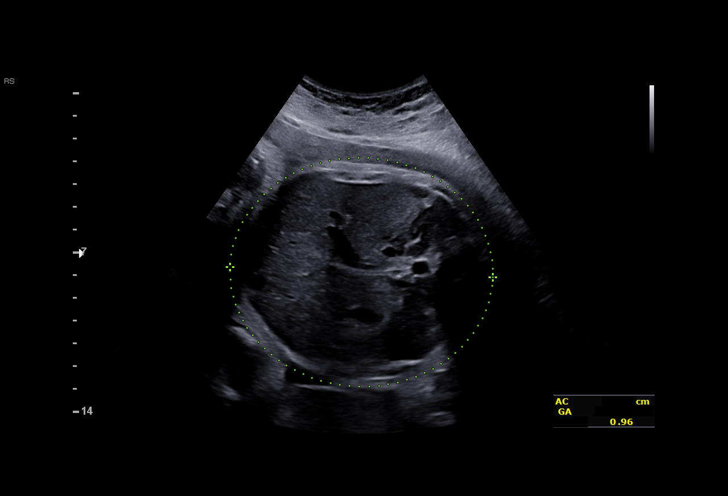
[im 29/41]
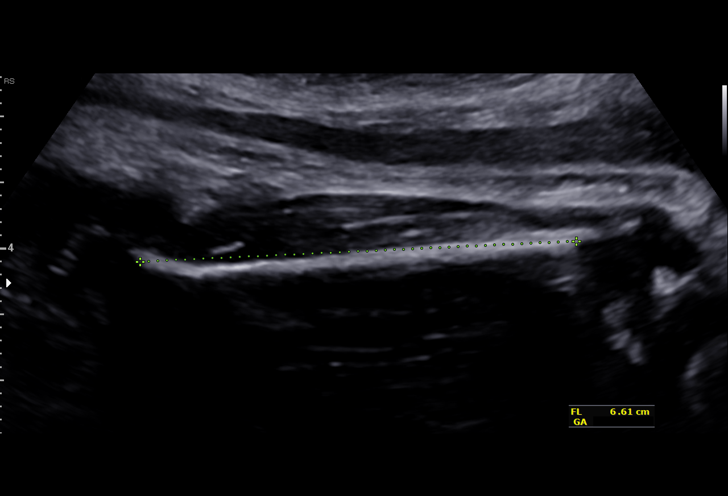
[im 32/41]
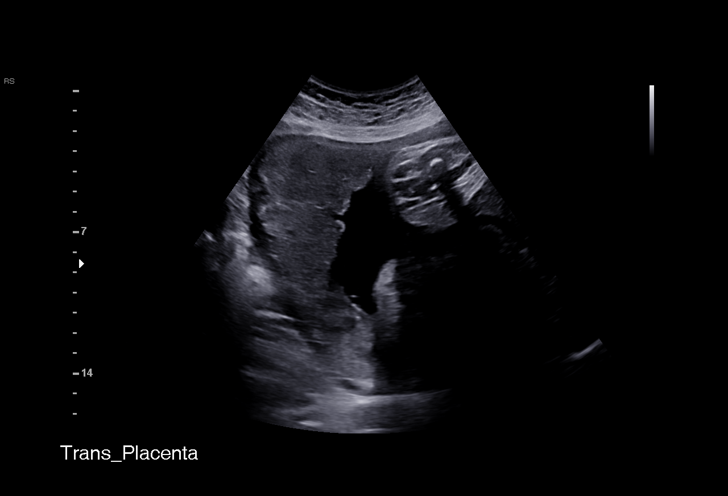
[im 35/41]
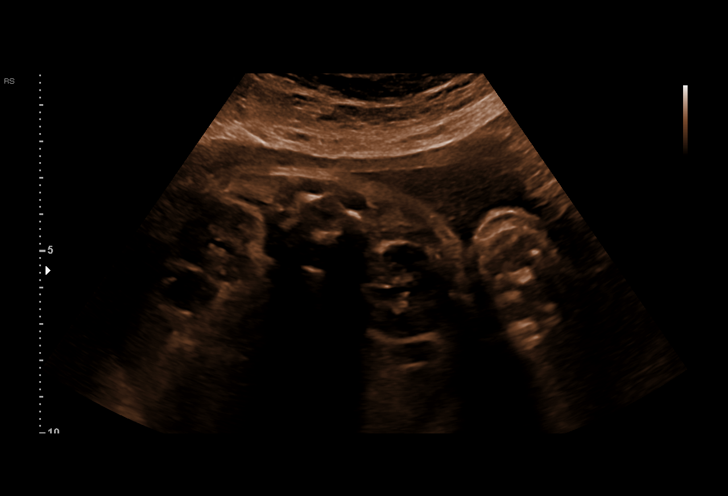
[im 38/41]
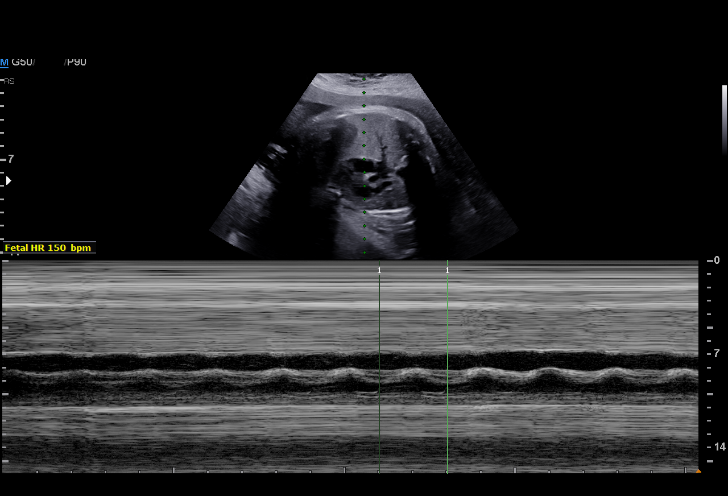
[im 41/41]
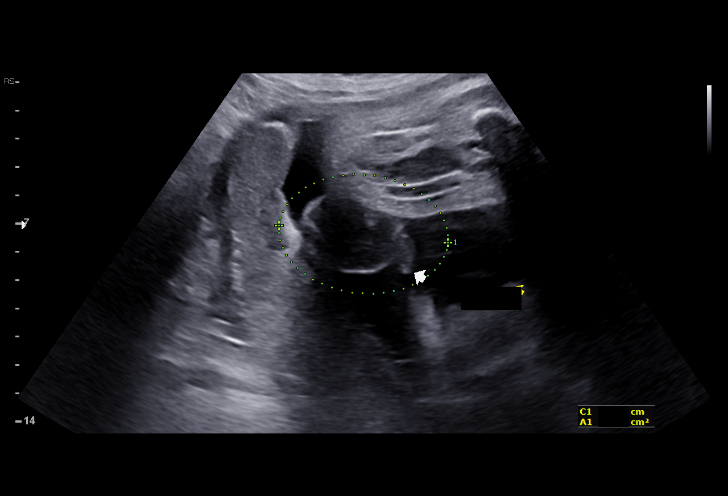

[14 of 28 positions shown; findings below may reference images not displayed]

[facility]

OB/Gyn Clinic

1  QIANQIAN SIJIA            [id number]      [phone]     [id number]
Indications

34 weeks gestation of pregnancy
Rh negative state in antepartum
Encounter for other antenatal screening
follow-up
Gestational diabetes in pregnancy,
unspecified control (Dx @ 11wks)
OB History

Gravidity:    1         Term:   0        Prem:   0        SAB:   0
TOP:          0       Ectopic:  0        Living: 0
Fetal Evaluation

Num Of Fetuses:     1
Fetal Heart         150
Rate(bpm):
Cardiac Activity:   Observed
Presentation:       Cephalic
Placenta:           Posterior, above cervical os
P. Cord Insertion:  Previously Visualized

Amniotic Fluid
AFI FV:      Subjectively within normal limits

AFI Sum(cm)     %Tile       Largest Pocket(cm)
19.82           74
RUQ(cm)       RLQ(cm)       LUQ(cm)        LLQ(cm)
4.64
Biometry

BPD:        93  mm     G. Age:  37w 6d       > 99  %    CI:        79.53   %    70 - 86
FL/HC:      20.0   %    19.4 -
HC:      329.6  mm     G. Age:  37w 4d         86  %    HC/AC:      0.96        0.96 -
AC:      342.1  mm     G. Age:  38w 1d       > 97  %    FL/BPD:     70.9   %    71 - 87
FL:       65.9  mm     G. Age:  34w 0d         29  %    FL/AC:      19.3   %    20 - 24
HUM:      56.2  mm     G. Age:  32w 5d         25  %

Est. FW:    8616  gm    6 lb 13 oz    > 90  %
Gestational Age

LMP:           34w 3d        Date:  06/03/16                 EDD:   03/10/17
U/S Today:     36w 6d                                        EDD:   02/21/17
Best:          34w 3d     Det. By:  LMP  (06/03/16)          EDD:   03/10/17
Anatomy

Cranium:               Appears normal         Aortic Arch:            Previously seen
Cavum:                 Previously seen        Ductal Arch:            Previously seen
Ventricles:            Appears normal         Diaphragm:              Previously seen
Choroid Plexus:        Previously seen        Stomach:                Appears normal, left
sided
Cerebellum:            Previously seen        Abdomen:                Appears normal
Posterior Fossa:       Previously seen        Abdominal Wall:         Previously seen
Nuchal Fold:           Not applicable (>20    Cord Vessels:           Previously seen
wks GA)
Face:                  Orbits and profile     Kidneys:                Appear normal
previously seen
Lips:                  Previously seen        Bladder:                Appears normal
Thoracic:              Appears normal         Spine:                  Previously seen
Heart:                 Previously seen        Upper Extremities:      Previously seen
RVOT:                  Previously seen        Lower Extremities:      Previously seen
LVOT:                  Previously seen

Other:  Fetus appears to be a male. Heels and 5th digit previously visualized.
Nasal bone previously visualized. Technically difficult due to fetal
position.
Cervix Uterus Adnexa

Cervix
Not visualized (advanced GA >38wks)

Uterus
No abnormality visualized.

Left Ovary
Not visualized.

Right Ovary
Not visualized.

Adnexa:       No abnormality visualized. No adnexal mass
visualized.
Impression

SIUP at 43w4d
active fetus
EFW >90th%'le, AC >97th%'le
no defects seen
AFI is normal
Recommendations

Interval growth in 4 weeks
Begin antenatal testing if patient requires medication for
glycemic control.

## 2019-11-28 IMAGING — US US FETAL BPP W/ NON-STRESS
1 series · 13 of 13 positions shown · non-contrast
Comparison: none

[Series 1: us fetal bpp w/nonstress · 13 acquisitions, 13 frames shown]
[im 1/13]
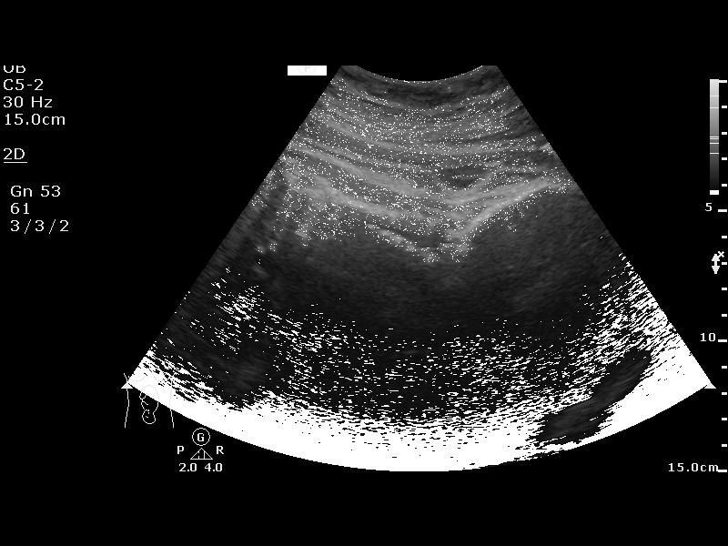
[im 2/13]
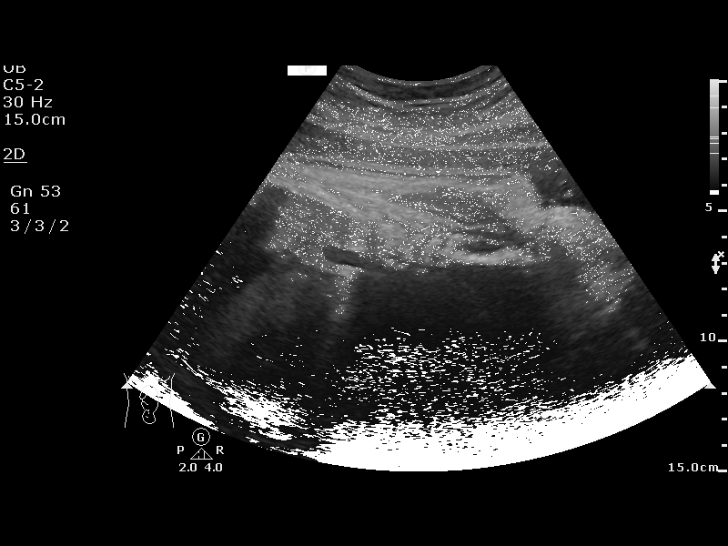
[im 3/13]
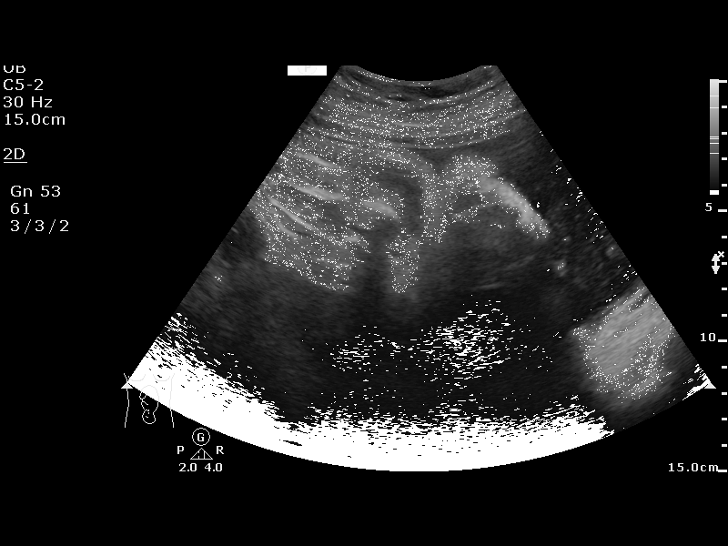
[im 4/13]
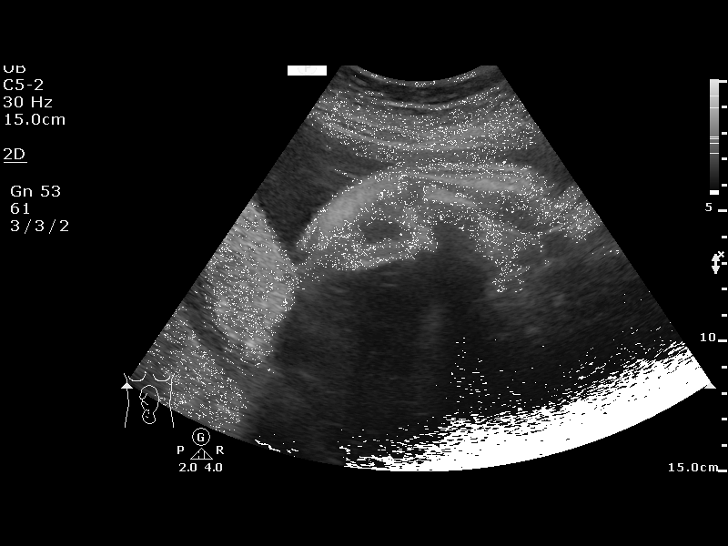
[im 5/13]
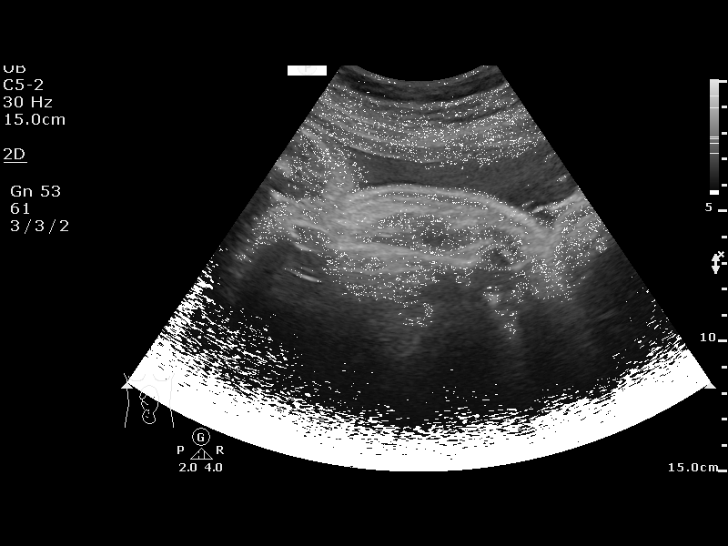
[im 6/13]
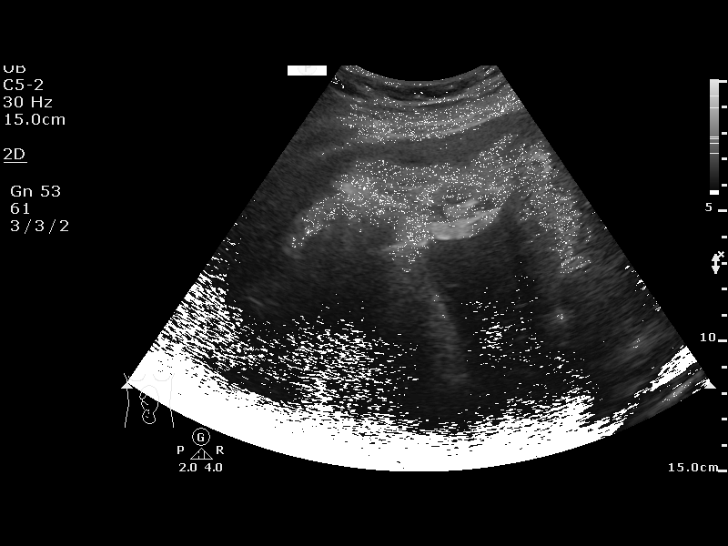
[im 7/13]
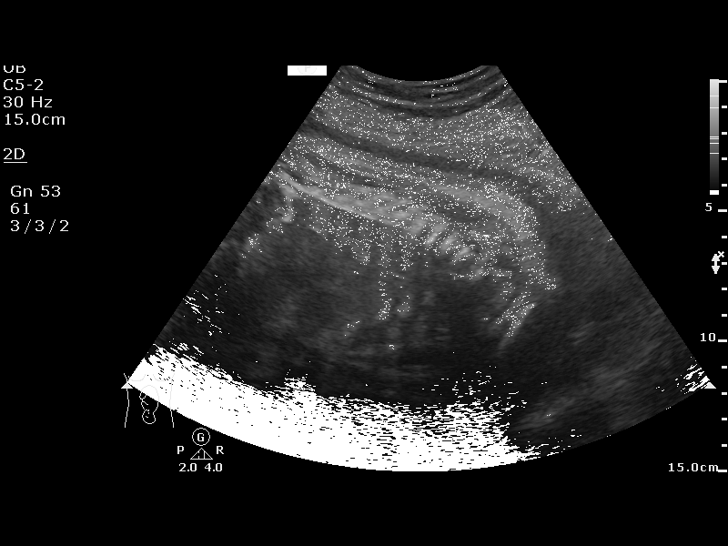
[im 8/13]
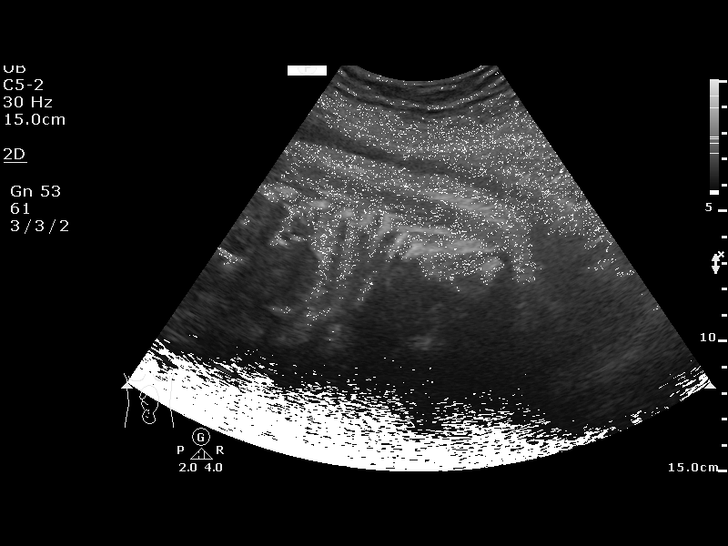
[im 9/13]
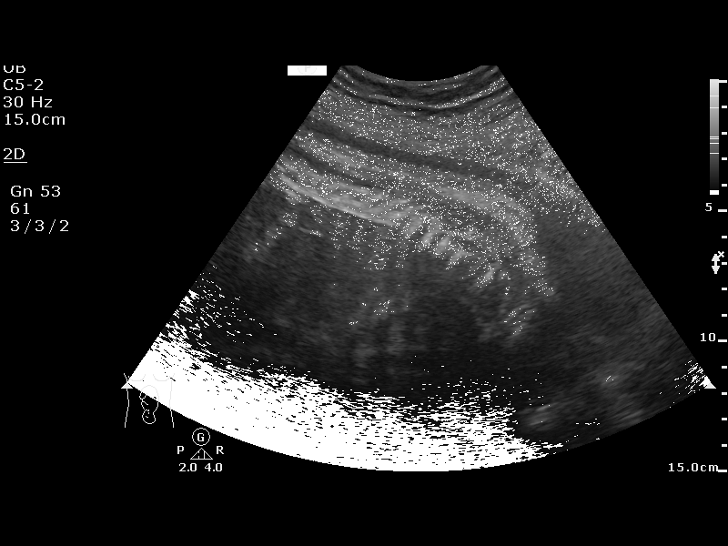
[im 10/13]
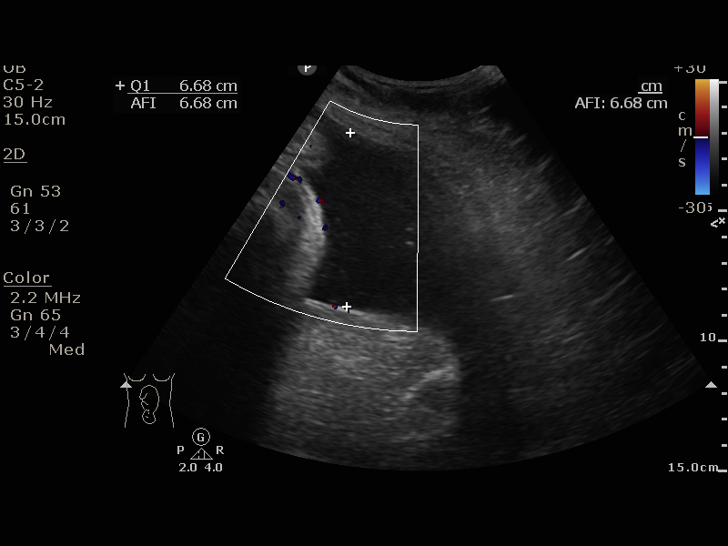
[im 11/13]
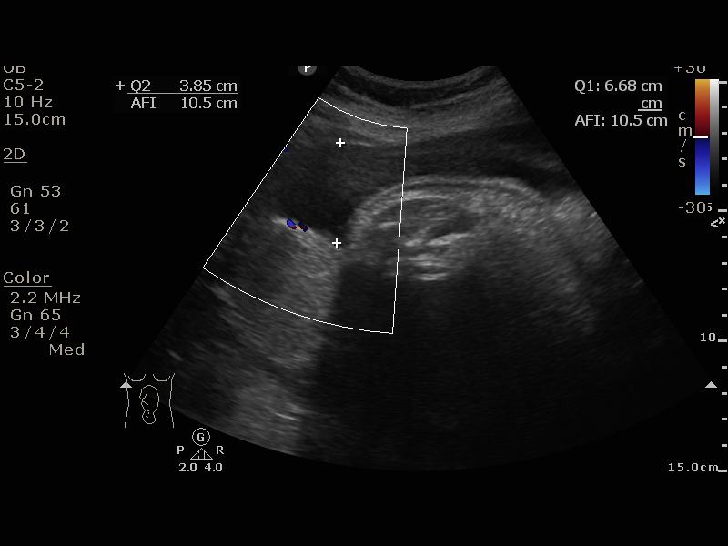
[im 12/13]
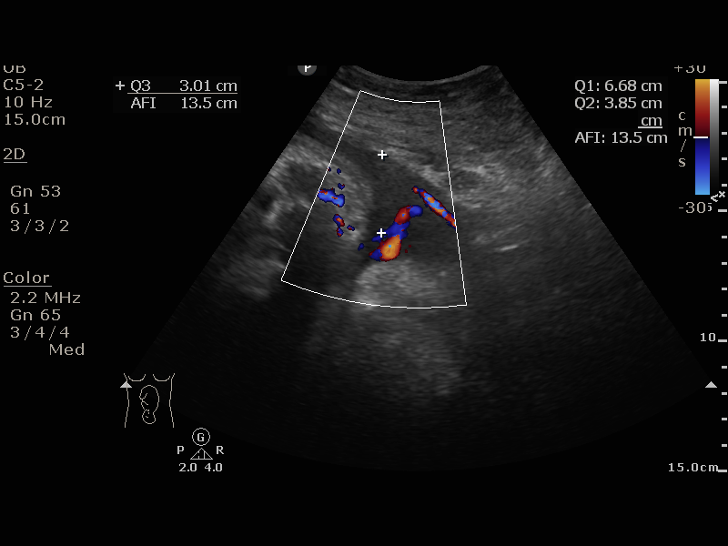
[im 13/13]
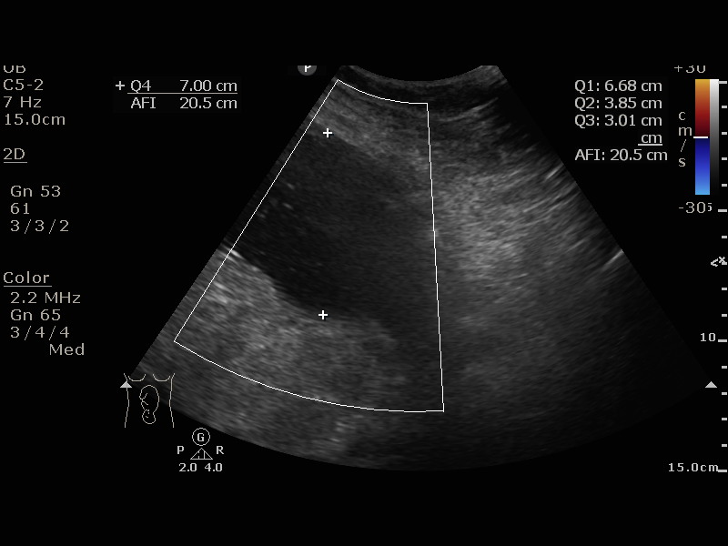

[13 of 13 positions shown; findings below may reference images not displayed]

IN

OB/Gyn Clinic
Attending:        Antonella Mcgill         [facility]         [facility]
[REDACTED]

1  US FETAL BPP W/NONSTRESS                    76818.4

1  DJAGUE NESTO           [id number]      [phone]     [id number]
Service(s) Provided

Indications

37 weeks gestation of pregnancy
Gestational diabetes in pregnancy,
controlled by oral hypoglycemic drugs
OB History

Gravidity:    1         Term:   0        Prem:   0        SAB:   0
TOP:          0       Ectopic:  0        Living: 0
Fetal Evaluation

Num Of Fetuses:     1
Preg. Location:     Intrauterine
Cardiac Activity:   Observed
Presentation:       Cephalic

Amniotic Fluid
AFI FV:      Subjectively upper-normal

AFI Sum(cm)     %Tile       Largest Pocket(cm)
20.54           80          7
RUQ(cm)       RLQ(cm)       LUQ(cm)        LLQ(cm)
6.68          7
Biophysical Evaluation

Amniotic F.V:   Pocket => 2 cm two         F. Tone:        Observed
planes
F. Movement:    Observed                   N.S.T:          Reactive
F. Breathing:   Observed                   Score:          [DATE]
Gestational Age

LMP:           37w 2d        Date:  06/03/16                 EDD:   03/10/17
Best:          37w 2d     Det. By:  LMP  (06/03/16)          EDD:   03/10/17
Impression

IUP at  86w0d with VIAVB
High-normal amniotic fluid volume
Recommendations

Continue recommended weekly antenatal testing.
